# Patient Record
Sex: Female | Born: 1990 | Hispanic: No | Marital: Single
Health system: Northeastern US, Academic
[De-identification: ages and names within clinical notes are randomized; demographics above are authoritative.]

## PROBLEM LIST (undated history)

## (undated) DIAGNOSIS — D649 Anemia, unspecified: Secondary | ICD-10-CM

## (undated) DIAGNOSIS — F909 Attention-deficit hyperactivity disorder, unspecified type: Secondary | ICD-10-CM

## (undated) DIAGNOSIS — R011 Cardiac murmur, unspecified: Secondary | ICD-10-CM

## (undated) DIAGNOSIS — N83209 Unspecified ovarian cyst, unspecified side: Secondary | ICD-10-CM

## (undated) DIAGNOSIS — Z5189 Encounter for other specified aftercare: Secondary | ICD-10-CM

## (undated) DIAGNOSIS — M1990 Unspecified osteoarthritis, unspecified site: Secondary | ICD-10-CM

## (undated) DIAGNOSIS — T7840XA Allergy, unspecified, initial encounter: Secondary | ICD-10-CM

## (undated) DIAGNOSIS — K219 Gastro-esophageal reflux disease without esophagitis: Secondary | ICD-10-CM

## (undated) DIAGNOSIS — E282 Polycystic ovarian syndrome: Secondary | ICD-10-CM

## (undated) HISTORY — DX: Cardiac murmur, unspecified: R01.1

## (undated) HISTORY — DX: Anemia, unspecified: D64.9

## (undated) HISTORY — DX: Gastro-esophageal reflux disease without esophagitis: K21.9

## (undated) HISTORY — DX: Allergy, unspecified, initial encounter: T78.40XA

## (undated) HISTORY — PX: NO PAST SURGERIES: SHX2092

## (undated) HISTORY — DX: Encounter for other specified aftercare: Z51.89

## (undated) HISTORY — DX: Unspecified osteoarthritis, unspecified site: M19.90

---
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
## 2011-07-20 ENCOUNTER — Encounter (HOSPITAL_COMMUNITY): Payer: Self-pay | Admitting: *Deleted

## 2011-07-20 ENCOUNTER — Emergency Department (HOSPITAL_COMMUNITY)
Admission: EM | Admit: 2011-07-20 | Discharge: 2011-07-20 | Disposition: A | Discharge status: Home / Self Care | Payer: Medicaid Other | Attending: Emergency Medicine | Admitting: Emergency Medicine

## 2011-07-20 DIAGNOSIS — R000 Tachycardia, unspecified: Secondary | ICD-10-CM | POA: Insufficient documentation

## 2011-07-20 DIAGNOSIS — J3489 Other specified disorders of nose and nasal sinuses: Secondary | ICD-10-CM | POA: Insufficient documentation

## 2011-07-20 DIAGNOSIS — R059 Cough, unspecified: Secondary | ICD-10-CM | POA: Insufficient documentation

## 2011-07-20 DIAGNOSIS — R509 Fever, unspecified: Secondary | ICD-10-CM | POA: Insufficient documentation

## 2011-07-20 DIAGNOSIS — R51 Headache: Secondary | ICD-10-CM | POA: Insufficient documentation

## 2011-07-20 DIAGNOSIS — IMO0001 Reserved for inherently not codable concepts without codable children: Secondary | ICD-10-CM | POA: Insufficient documentation

## 2011-07-20 DIAGNOSIS — R05 Cough: Secondary | ICD-10-CM | POA: Insufficient documentation

## 2011-07-20 DIAGNOSIS — R6889 Other general symptoms and signs: Secondary | ICD-10-CM | POA: Insufficient documentation

## 2011-07-20 DIAGNOSIS — J029 Acute pharyngitis, unspecified: Secondary | ICD-10-CM | POA: Insufficient documentation

## 2011-07-20 DIAGNOSIS — R599 Enlarged lymph nodes, unspecified: Secondary | ICD-10-CM | POA: Insufficient documentation

## 2011-07-20 MED ORDER — DEXAMETHASONE SODIUM PHOSPHATE 10 MG/ML IJ SOLN
10.0000 mg | Freq: Once | INTRAMUSCULAR | Status: AC
  Administered 2011-07-20: 10 mg via INTRAMUSCULAR
  Filled 2011-07-20: qty 1

## 2011-07-20 NOTE — ED Notes (Signed)
Aching all over her body cold nasal congestion headache and she has a sorethroat for 2 days

## 2011-07-20 NOTE — ED Provider Notes (Signed)
History     CSN: [id number]  Arrival date & time 07/20/11  1949   First MD Initiated Contact with Patient 07/20/11 2125      Chief Complaint  Patient presents with  . Chills    (Consider location/radiation/quality/duration/timing/severity/associated sxs/prior treatment) HPI Comments: Patient here with a several day history of fever, sore throat, cough, runny nose, nasal congestion, and headaches - states fever never over 100 - no difficulty swallowing - reports pain with swallowing and swollen glands.  Patient is a 21 y.o. female presenting with pharyngitis. The history is provided by the patient. No language interpreter was used.  Sore Throat This is a new problem. The current episode started yesterday. The problem occurs constantly. The problem has been gradually worsening. Associated symptoms include congestion, coughing, a fever, headaches, myalgias, a sore throat and swollen glands. Pertinent negatives include no abdominal pain, arthralgias, chest pain, chills, diaphoresis, fatigue, nausea, neck pain, numbness, rash, visual change, vomiting or weakness. The symptoms are aggravated by nothing. She has tried nothing for the symptoms. The treatment provided no relief.  Sore Throat This is a new problem. The current episode started yesterday. The problem occurs constantly. The problem has been gradually worsening. Associated symptoms include headaches. Pertinent negatives include no chest pain and no abdominal pain. The symptoms are aggravated by nothing. She has tried nothing for the symptoms. The treatment provided no relief.    History reviewed. No pertinent past medical history.  History reviewed. No pertinent past surgical history.  History reviewed. No pertinent family history.  History  Substance Use Topics  . Smoking status: Never Smoker   . Smokeless tobacco: Not on file  . Alcohol Use: No    OB History    Grav Para Term Preterm Abortions TAB SAB Ect Mult Living              Review of Systems  Constitutional: Positive for fever. Negative for chills, diaphoresis and fatigue.  HENT: Positive for congestion and sore throat. Negative for neck pain.   Respiratory: Positive for cough.   Cardiovascular: Negative for chest pain.  Gastrointestinal: Negative for nausea, vomiting and abdominal pain.  Musculoskeletal: Positive for myalgias. Negative for arthralgias.  Skin: Negative for rash.  Neurological: Positive for headaches. Negative for weakness and numbness.  All other systems reviewed and are negative.    Allergies  Review of patient's allergies indicates no known allergies.  Home Medications  No current outpatient prescriptions on file.  BP 132/74  Pulse 99  Temp(Src) 99 F (37.2 C) (Oral)  Resp 18  Ht 5' 7.5" (1.715 m)  Wt 123 lb (55.792 kg)  BMI 18.98 kg/m2  SpO2 100%  LMP 07/20/2011  Physical Exam  Nursing note and vitals reviewed. Constitutional: She is oriented to person, place, and time. She appears well-developed and well-nourished. No distress.  HENT:  Head: Normocephalic and atraumatic.  Right Ear: External ear normal.  Left Ear: External ear normal.  Nose: Nose normal.  Mouth/Throat: Posterior oropharyngeal erythema present. No oropharyngeal exudate or tonsillar abscesses.  Eyes: Conjunctivae are normal. Pupils are equal, round, and reactive to light. No scleral icterus.  Neck: Normal range of motion. Neck supple.  Cardiovascular: Regular rhythm and normal heart sounds.  Exam reveals no gallop and no friction rub.   No murmur heard.      tachycardia  Pulmonary/Chest: Effort normal and breath sounds normal. No respiratory distress. She exhibits no tenderness.  Abdominal: Soft. Bowel sounds are normal. She exhibits no distension. There is  no tenderness.  Musculoskeletal: Normal range of motion.  Lymphadenopathy:    She has cervical adenopathy.  Neurological: She is alert and oriented to person, place, and time. No  cranial nerve deficit.  Skin: Skin is warm and dry. No rash noted. No erythema. No pallor.  Psychiatric: She has a normal mood and affect. Her behavior is normal. Judgment and thought content normal.    ED Course  Procedures (including critical care time)   Labs Reviewed  RAPID STREP SCREEN   No results found.   Results for orders placed during the hospital encounter of 07/20/11  RAPID STREP SCREEN      Component Value Range   Streptococcus, Group A Screen (Direct) NEGATIVE  NEGATIVE    No results found. Pharyngitis   MDM  Strep negative - likely viral pharyngitis vs viral URI - given decadron for symptoms - patient will continue OTC medicationsl    Medical screening examination/treatment/procedure(s) were performed by non-physician practitioner and as supervising physician I was immediately available for consultation/collaboration. Osvaldo Human, M.D.     Izola Price [place], [place] 07/20/11 2228  Carleene Cooper III, MD 07/21/11 [phone]

## 2011-07-20 NOTE — ED Notes (Signed)
Discharge instructions reviewed with pt; verbalizes understanding. No questions asked; no further c/o voiced.  Ambulatory to lobby.

## 2011-08-23 ENCOUNTER — Encounter (HOSPITAL_COMMUNITY): Payer: Self-pay | Admitting: Emergency Medicine

## 2011-08-23 ENCOUNTER — Emergency Department (HOSPITAL_COMMUNITY): Payer: Medicaid Other

## 2011-08-23 ENCOUNTER — Emergency Department (HOSPITAL_COMMUNITY)
Admission: EM | Admit: 2011-08-23 | Discharge: 2011-08-23 | Disposition: A | Discharge status: Home / Self Care | Payer: Medicaid Other | Attending: Emergency Medicine | Admitting: Emergency Medicine

## 2011-08-23 DIAGNOSIS — S39012A Strain of muscle, fascia and tendon of lower back, initial encounter: Secondary | ICD-10-CM

## 2011-08-23 DIAGNOSIS — X58XXXA Exposure to other specified factors, initial encounter: Secondary | ICD-10-CM | POA: Insufficient documentation

## 2011-08-23 DIAGNOSIS — M545 Low back pain, unspecified: Secondary | ICD-10-CM | POA: Insufficient documentation

## 2011-08-23 DIAGNOSIS — S335XXA Sprain of ligaments of lumbar spine, initial encounter: Secondary | ICD-10-CM | POA: Insufficient documentation

## 2011-08-23 MED ORDER — IBUPROFEN 800 MG PO TABS: 800.0000 mg | ORAL_TABLET | Freq: Three times a day (TID) | ORAL | Status: AC

## 2011-08-23 NOTE — ED Notes (Signed)
PT c/o lower back pain for approx 1 yr.  St's normally has pain from upper back to lower back. But today its lower back

## 2011-08-23 NOTE — ED Notes (Signed)
Pt with normal, steady gait. Sitting in chair, reading, pt in no distress.

## 2011-08-23 NOTE — ED Provider Notes (Signed)
History     CSN: [id number]  Arrival date & time 08/23/11  1624   First MD Initiated Contact with Patient 08/23/11 1809      Chief Complaint  Patient presents with  . Back Pain    (Consider location/radiation/quality/duration/timing/severity/associated sxs/prior treatment) HPI Comments:  patient presents to the emergency department complains of pain to her low back area she says been going on for about one year intermittently. At times radiates to her upper back but primarily in her lower back. Deniesany. radiation down her legs. Denies any numbness or weakness in her legs. Denies any abdominal pain. Denies any loss of bowel or bladder function. She initially thought it was a mattress in got a new mattress but it did not help her pain. Denies any injuries  Patient is a 21 y.o. female presenting with back pain. The history is provided by the patient.  Back Pain  This is a recurrent problem. Pertinent negatives include no chest pain, no fever, no numbness, no headaches, no abdominal pain and no weakness.    History reviewed. No pertinent past medical history.  History reviewed. No pertinent past surgical history.  No family history on file.  History  Substance Use Topics  . Smoking status: Never Smoker   . Smokeless tobacco: Not on file  . Alcohol Use: No    OB History    Grav Para Term Preterm Abortions TAB SAB Ect Mult Living                  Review of Systems  Constitutional: Negative for fever, chills, diaphoresis and fatigue.  HENT: Negative for congestion, rhinorrhea and sneezing.   Eyes: Negative.   Respiratory: Negative for cough, chest tightness and shortness of breath.   Cardiovascular: Negative for chest pain and leg swelling.  Gastrointestinal: Negative for nausea, vomiting, abdominal pain, diarrhea and blood in stool.  Genitourinary: Negative for frequency, hematuria, flank pain and difficulty urinating.  Musculoskeletal: Positive for back pain. Negative  for arthralgias.  Skin: Negative for rash.  Neurological: Negative for dizziness, speech difficulty, weakness, numbness and headaches.    Allergies  Review of patient's allergies indicates no known allergies.  Home Medications   Current Outpatient Rx  Name Route Sig Dispense Refill  . IBUPROFEN 800 MG PO TABS Oral Take 1 tablet (800 mg total) by mouth 3 (three) times daily. 21 tablet 0    BP 125/66  Pulse 104  Temp(Src) 98.3 F (36.8 C) (Oral)  Resp 18  SpO2 99%  LMP 08/23/2011  Physical Exam  Constitutional: She is oriented to person, place, and time. She appears well-developed and well-nourished.  HENT:  Head: Normocephalic and atraumatic.  Eyes: Pupils are equal, round, and reactive to light.  Neck: Normal range of motion. Neck supple.  Cardiovascular: Normal rate, regular rhythm and normal heart sounds.   Pulmonary/Chest: Effort normal and breath sounds normal. No respiratory distress. She has no wheezes. She has no rales. She exhibits no tenderness.  Abdominal: Soft. Bowel sounds are normal. There is no tenderness. There is no rebound and no guarding.  Musculoskeletal: Normal range of motion. She exhibits no edema.       Mild pain to paraspinal area of lumbar spine, neg SLR bilaterally, patellar reflexes symmetric.    Lymphadenopathy:    She has no cervical adenopathy.  Neurological: She is alert and oriented to person, place, and time. She has normal strength. No sensory deficit.  Skin: Skin is warm and dry. No rash noted.  Psychiatric: She has a normal mood and affect.    ED Course  Procedures (including critical care time)  Labs Reviewed - No data to display Dg Lumbar Spine Complete  08/23/2011  *RADIOLOGY REPORT*  Clinical Data: Chronic low back pain.  No injury  LUMBAR SPINE - COMPLETE 4+ VIEW  Comparison:  None.  Findings:  There is no evidence of lumbar spine fracture. Alignment is normal.  Intervertebral disc spaces are maintained.  IMPRESSION: Negative.   Original Report Authenticated By: Camelia Phenes, M.D.     1. Back strain       MDM  Pain is likely musculoskeletal. There is no evidence of back injury or masses to the lumbar spine. Will give patient exercises to do and refer to ortho for further treatment as needed        Rolan Bucco, MD 08/23/11 1901

## 2011-08-23 NOTE — Discharge Instructions (Signed)
Back Exercises Back exercises help treat and prevent back injuries. The goal of back exercises is to increase the strength of your abdominal and back muscles and the flexibility of your back. These exercises should be started when you no longer have back pain. Back exercises include:  Pelvic Tilt. Lie on your back with your knees bent. Tilt your pelvis until the lower part of your back is against the floor. Hold this position 5 to 10 sec and repeat 5 to 10 times.   Knee to Chest. Pull first 1 knee up against your chest and hold for 20 to 30 seconds, repeat this with the other knee, and then both knees. This may be done with the other leg straight or bent, whichever feels better.   Sit-Ups or Curl-Ups. Bend your knees 90 degrees. Start with tilting your pelvis, and do a partial, slow sit-up, lifting your trunk only 30 to 45 degrees off the floor. Take at least 2 to 3 seconds for each sit-up. Do not do sit-ups with your knees out straight. If partial sit-ups are difficult, simply do the above but with only tightening your abdominal muscles and holding it as directed.   Hip-Lift. Lie on your back with your knees flexed 90 degrees. Push down with your feet and shoulders as you raise your hips a couple inches off the floor; hold for 10 seconds, repeat 5 to 10 times.   Back arches. Lie on your stomach, propping yourself up on bent elbows. Slowly press on your hands, causing an arch in your low back. Repeat 3 to 5 times. Any initial stiffness and discomfort should lessen with repetition over time.   Shoulder-Lifts. Lie face down with arms beside your body. Keep hips and torso pressed to floor as you slowly lift your head and shoulders off the floor.  Do not overdo your exercises, especially in the beginning. Exercises may cause you some mild back discomfort which lasts for a few minutes; however, if the pain is more severe, or lasts for more than 15 minutes, do not continue exercises until you see your  caregiver. Improvement with exercise therapy for back problems is slow.  See your caregivers for assistance with developing a proper back exercise program. Document Released: 07/29/2004 Document Revised: 02/17/2011 Document Reviewed: 06/21/2005 Exit[employer] Patient Information 2012 [employer].Back Pain, Adult Back pain is very common. The pain often gets better over time. The cause of back pain is usually not dangerous. Most people can learn to manage their back pain on their own.  HOME CARE   Stay active. Start with short walks on flat ground if you can. Try to walk farther each day.   Do not sit, drive, or stand in one place for more than 30 minutes. Do not stay in bed.   Do not avoid exercise or work. Activity can help your back heal faster.   Be careful when you bend or lift an object. Bend at your knees, keep the object close to you, and do not twist.   Sleep on a firm mattress. Lie on your side, and bend your knees. If you lie on your back, put a pillow under your knees.   Only take medicines as told by your doctor.   Put ice on the injured area.   Put ice in a plastic bag.   Place a towel between your skin and the bag.   Leave the ice on for 15 to 20 minutes, 3 to 4 times a day for the first   2 to 3 days. After that, you can switch between ice and heat packs.   Ask your doctor about back exercises or massage.   Avoid feeling anxious or stressed. Find good ways to deal with stress, such as exercise.  GET HELP RIGHT AWAY IF:   Your pain does not go away with rest or medicine.   Your pain does not go away in 1 week.   You have new problems.   You do not feel well.   The pain spreads into your legs.   You cannot control when you poop (bowel movement) or pee (urinate).   Your arms or legs feel weak or lose feeling (numbness).   You feel sick to your stomach (nauseous) or throw up (vomit).   You have belly (abdominal) pain.   You feel like you may pass out (faint).    MAKE SURE YOU:   Understand these instructions.   Will watch your condition.   Will get help right away if you are not doing well or get worse.  Document Released: 12/08/2007 Document Revised: 03/03/2011 Document Reviewed: 11/09/2010 [employer] Patient Information 2012 [employer]. 

## 2012-01-27 ENCOUNTER — Ambulatory Visit: Payer: Medicaid Other | Attending: Physical Medicine and Rehabilitation | Admitting: Physical Therapy

## 2012-01-27 DIAGNOSIS — IMO0001 Reserved for inherently not codable concepts without codable children: Secondary | ICD-10-CM | POA: Insufficient documentation

## 2012-01-27 DIAGNOSIS — M545 Low back pain, unspecified: Secondary | ICD-10-CM | POA: Insufficient documentation

## 2012-01-27 DIAGNOSIS — R293 Abnormal posture: Secondary | ICD-10-CM | POA: Insufficient documentation

## 2012-02-08 ENCOUNTER — Ambulatory Visit: Payer: Medicaid Other | Attending: Physical Medicine and Rehabilitation | Admitting: Rehabilitation

## 2012-02-08 DIAGNOSIS — IMO0001 Reserved for inherently not codable concepts without codable children: Secondary | ICD-10-CM | POA: Insufficient documentation

## 2012-02-08 DIAGNOSIS — M545 Low back pain, unspecified: Secondary | ICD-10-CM | POA: Insufficient documentation

## 2012-02-08 DIAGNOSIS — R293 Abnormal posture: Secondary | ICD-10-CM | POA: Insufficient documentation

## 2012-02-10 ENCOUNTER — Ambulatory Visit: Payer: Medicaid Other | Admitting: Rehabilitation

## 2012-02-15 ENCOUNTER — Ambulatory Visit: Payer: Medicaid Other | Admitting: Rehabilitation

## 2012-02-17 ENCOUNTER — Ambulatory Visit: Payer: Medicaid Other | Admitting: Rehabilitation

## 2012-02-22 ENCOUNTER — Ambulatory Visit: Payer: Medicaid Other | Admitting: Physical Therapy

## 2012-02-24 ENCOUNTER — Encounter: Payer: Medicaid Other | Admitting: Physical Therapy

## 2012-02-29 ENCOUNTER — Ambulatory Visit: Payer: Medicaid Other | Admitting: Physical Therapy

## 2012-02-29 ENCOUNTER — Ambulatory Visit: Payer: Medicaid Other | Admitting: Rehabilitation

## 2012-03-02 ENCOUNTER — Encounter: Payer: Medicaid Other | Admitting: Physical Therapy

## 2012-05-19 ENCOUNTER — Encounter (HOSPITAL_COMMUNITY): Payer: Self-pay | Admitting: Emergency Medicine

## 2012-05-19 ENCOUNTER — Emergency Department (INDEPENDENT_AMBULATORY_CARE_PROVIDER_SITE_OTHER)
Admission: EM | Admit: 2012-05-19 | Discharge: 2012-05-19 | Disposition: A | Discharge status: Home / Self Care | Payer: Medicaid Other | Source: Home / Self Care | Attending: Family Medicine | Admitting: Family Medicine

## 2012-05-19 DIAGNOSIS — J329 Chronic sinusitis, unspecified: Secondary | ICD-10-CM

## 2012-05-19 DIAGNOSIS — J069 Acute upper respiratory infection, unspecified: Secondary | ICD-10-CM

## 2012-05-19 HISTORY — DX: Attention-deficit hyperactivity disorder, unspecified type: F90.9

## 2012-05-19 MED ORDER — INFLUENZA VIRUS VACC SPLIT PF IM SUSP: 0.5000 mL | INTRAMUSCULAR | Status: DC

## 2012-05-19 MED ORDER — FLUTICASONE PROPIONATE 50 MCG/ACT NA SUSP: 2.0000 | Freq: Every day | NASAL | Status: DC

## 2012-05-19 MED ORDER — SALINE NASAL SPRAY 0.65 % NA SOLN: 1.0000 | NASAL | Status: DC | PRN

## 2012-05-19 NOTE — ED Notes (Signed)
Pt c/o an episode of "streaks of blood" when she blew her nose this am.... Has not noticed anymore but she just got over a cold... Sx today include: nasal congestion.... Denies: fevers, vomiting, nauseas, diarrhea, sore throat.... Pt is alert w/no signs of distress

## 2012-05-19 NOTE — ED Provider Notes (Signed)
History     CSN: [id number]  Arrival date & time 05/19/12  0944   First MD Initiated Contact with Patient 05/19/12 1036      Chief Complaint  Patient presents with  . URI    (Consider location/radiation/quality/duration/timing/severity/associated sxs/prior treatment) Patient is a 21 y.o. female presenting with URI. The history is provided by the patient.  URI The primary symptoms include cough. The current episode started more than 1 week ago. This is a new problem. The problem has been gradually improving.  The onset of the illness is associated with exposure to sick contacts and recent travel. Symptoms associated with the illness include sinus pressure and congestion. The illness is not associated with chills, plugged ear sensation, facial pain or rhinorrhea. The following treatments were addressed: Acetaminophen was ineffective. A decongestant was effective. Aspirin was not tried. NSAIDs were not tried.  expresses concern in regards to blowing blood tinged mucus from nose.  Past Medical History  Diagnosis Date  . ADHD (attention deficit hyperactivity disorder)     History reviewed. No pertinent past surgical history.  No family history on file.  History  Substance Use Topics  . Smoking status: Never Smoker   . Smokeless tobacco: Not on file  . Alcohol Use: No    OB History    Grav Para Term Preterm Abortions TAB SAB Ect Mult Living                  Review of Systems  Constitutional: Negative for chills.  HENT: Positive for congestion, sneezing and sinus pressure. Negative for rhinorrhea.   Respiratory: Positive for cough.   All other systems reviewed and are negative.    Allergies  Review of patient's allergies indicates no known allergies.  Home Medications   Current Outpatient Rx  Name  Route  Sig  Dispense  Refill  . AMPHETAMINE-DEXTROAMPHETAMINE 10 MG PO TABS   Oral   Take 10 mg by mouth daily.         . ETONOGESTREL 68 MG [place] IMPL  Subcutaneous   Inject 1 each into the skin once.         Marland Kitchen FLUTICASONE PROPIONATE 50 MCG/ACT NA SUSP   Nasal   Place 2 sprays into the nose daily.   16 g   2   . SALINE NASAL SPRAY 0.65 % NA SOLN   Nasal   Place 1 spray into the nose as needed for congestion.   30 mL   12     BP 126/70  Pulse 86  Temp 98.1 F (36.7 C) (Oral)  Resp 16  SpO2 100%  Physical Exam  Nursing note and vitals reviewed. Constitutional: She is oriented to person, place, and time. Vital signs are normal. She appears well-developed and well-nourished. She is active and cooperative.  HENT:  Head: Normocephalic.  Right Ear: Hearing, tympanic membrane, external ear and ear canal normal.  Left Ear: Hearing, tympanic membrane, external ear and ear canal normal.  Nose: Nose normal. Right sinus exhibits no maxillary sinus tenderness and no frontal sinus tenderness. Left sinus exhibits no maxillary sinus tenderness and no frontal sinus tenderness.  Mouth/Throat: Uvula is midline, oropharynx is clear and moist and mucous membranes are normal.  Eyes: Conjunctivae normal are normal. Pupils are equal, round, and reactive to light. No scleral icterus.  Neck: Trachea normal. Neck supple.  Cardiovascular: Normal rate, regular rhythm, intact distal pulses and normal pulses.   Pulmonary/Chest: Effort normal and breath sounds normal.  Lymphadenopathy:  Head (right side): No submental, no submandibular, no tonsillar, no preauricular, no posterior auricular and no occipital adenopathy present.       Head (left side): No submental, no submandibular, no tonsillar, no preauricular, no posterior auricular and no occipital adenopathy present.  Neurological: She is alert and oriented to person, place, and time. No cranial nerve deficit or sensory deficit.  Skin: Skin is warm and dry.  Psychiatric: She has a normal mood and affect. Her speech is normal and behavior is normal. Judgment and thought content normal. Cognition  and memory are normal.    ED Course  Procedures (including critical care time)  Labs Reviewed - No data to display No results found.   1. Sinusitis   2. URI (upper respiratory infection)       MDM  Increase fluids, nasal saline, flonase.       Johnsie Kindred, NP 05/19/12 1115

## 2012-05-23 NOTE — ED Provider Notes (Signed)
Medical screening examination/treatment/procedure(s) were performed by resident physician or non-physician practitioner and as supervising physician I was immediately available for consultation/collaboration.   Shakai Dolley DOUGLAS MD.    Wreatha Sturgeon D Erasmo Vertz, MD 05/23/12 1055 

## 2012-09-08 ENCOUNTER — Encounter: Payer: Medicaid Other | Admitting: Obstetrics & Gynecology

## 2012-09-28 ENCOUNTER — Ambulatory Visit (INDEPENDENT_AMBULATORY_CARE_PROVIDER_SITE_OTHER): Payer: Medicaid Other | Admitting: Obstetrics & Gynecology

## 2012-09-28 ENCOUNTER — Encounter: Payer: Self-pay | Admitting: Obstetrics & Gynecology

## 2012-09-28 VITALS — BP 110/71 | HR 85 | Temp 98.4°F | Ht 67.0 in | Wt 130.9 lb

## 2012-09-28 DIAGNOSIS — R102 Pelvic and perineal pain: Secondary | ICD-10-CM | POA: Insufficient documentation

## 2012-09-28 DIAGNOSIS — N926 Irregular menstruation, unspecified: Secondary | ICD-10-CM | POA: Insufficient documentation

## 2012-09-28 DIAGNOSIS — N921 Excessive and frequent menstruation with irregular cycle: Secondary | ICD-10-CM | POA: Insufficient documentation

## 2012-09-28 DIAGNOSIS — N949 Unspecified condition associated with female genital organs and menstrual cycle: Secondary | ICD-10-CM

## 2012-09-28 DIAGNOSIS — Z975 Presence of (intrauterine) contraceptive device: Secondary | ICD-10-CM | POA: Insufficient documentation

## 2012-09-28 DIAGNOSIS — O26899 Other specified pregnancy related conditions, unspecified trimester: Secondary | ICD-10-CM | POA: Insufficient documentation

## 2012-09-28 MED ORDER — MEDROXYPROGESTERONE ACETATE 5 MG PO TABS: 5.0000 mg | ORAL_TABLET | Freq: Every day | ORAL | Status: DC

## 2012-09-28 NOTE — Progress Notes (Signed)
Patient ID: Sherri Welch, female   DOB: Feb 19, 1991, 22 y.o.   MRN: [id number]  Chief Complaint  Patient presents with  . Pelvic Pain    has family history of fibroids. Pain is not constant,  comes and goes. Feels the pain when she get cold, also it wakes her up at night randomly    HPI [name] is a 22 y.o. female.  [id number] No LMP recorded. Patient has had an implant. Irregular bleeding since Nexplanon placed last Feb. At [facility]. Bleeding daily since last month. Several years of occasional pelvic pain, cramps, often not related to menses. HPI  Past Medical History  Diagnosis Date  . ADHD (attention deficit hyperactivity disorder)   . Arthritis     knees and back    History reviewed. No pertinent past surgical history.  History reviewed. No pertinent family history.  Social History History  Substance Use Topics  . Smoking status: Never Smoker   . Smokeless tobacco: Not on file  . Alcohol Use: No    No Known Allergies  Current Outpatient Prescriptions  Medication Sig Dispense Refill  . amphetamine-dextroamphetamine (ADDERALL) 10 MG tablet Take 10 mg by mouth daily.      Marland Kitchen etonogestrel (NEXPLANON) 68 MG IMPL implant Inject 1 each into the skin once.      . fluticasone (FLONASE) 50 MCG/ACT nasal spray Place 2 sprays into the nose daily.  16 g  2  . medroxyPROGESTERone (PROVERA) 5 MG tablet Take 1 tablet (5 mg total) by mouth daily.  30 tablet  2  . sodium chloride (OCEAN NASAL SPRAY) 0.65 % nasal spray Place 1 spray into the nose as needed for congestion.  30 mL  12   No current facility-administered medications for this visit.    Review of Systems Review of Systems  Constitutional: Negative for fever and activity change.  Gastrointestinal: Negative for vomiting and abdominal distention.  Genitourinary: Positive for vaginal bleeding and pelvic pain. Negative for dysuria, vaginal discharge and dyspareunia.    Blood pressure 110/71, pulse 85, temperature 98.4 F  (36.9 C), temperature source Oral, height 5\' 7"  (1.702 m), weight 130 lb 14.4 oz (59.376 kg).  Physical Exam Physical Exam  Constitutional: She is oriented to person, place, and time. She appears well-developed. No distress.  Pulmonary/Chest: Effort normal. No respiratory distress.  Abdominal: Soft. She exhibits no mass. There is no tenderness.  Genitourinary: Vagina normal and uterus normal. No vaginal discharge found.  Slight blood,not tender, no mass  Neurological: She is alert and oriented to person, place, and time.  Psychiatric: She has a normal mood and affect. Her behavior is normal.    Data Reviewed Patient Active Problem List  Diagnosis  . Irregular uterine bleeding  . Pelvic pain in pregnancy  . Breakthrough bleeding on Nexplanon     Assessment     Patient Active Problem List  Diagnosis  . Irregular uterine bleeding  . Pelvic pain in pregnancy  . Breakthrough bleeding on Nexplanon      Plan    Provera 5 mg daily, RTC 6 weeks NSAID prn pain        ARNOLD,JAMES 09/28/2012, 4:58 PM

## 2012-09-28 NOTE — Patient Instructions (Signed)
Dysfunctional Uterine Bleeding  Normally, menstrual periods begin between ages 11 to 17 in young women. A normal menstrual cycle/period may begin every 23 days up to 35 days and lasts from 1 to 7 days. Around 12 to 14 days before your menstrual period starts, ovulation (ovary produces an egg) occurs. When counting the time between menstrual periods, count from the first day of bleeding of the previous period to the first day of bleeding of the next period.  Dysfunctional (abnormal) uterine bleeding is bleeding that is different from a normal menstrual period. Your periods may come earlier or later than usual. They may be lighter, have blood clots or be heavier. You may have bleeding between periods, or you may skip one period or more. You may have bleeding after sexual intercourse, bleeding after menopause, or no menstrual period.  CAUSES   · Pregnancy (normal, miscarriage, tubal).  · IUDs (intrauterine device, birth control).  · Birth control pills.  · Hormone treatment.  · Menopause.  · Infection of the cervix.  · Blood clotting problems.  · Infection of the inside lining of the uterus.  · Endometriosis, inside lining of the uterus growing in the pelvis and other female organs.  · Adhesions (scar tissue) inside the uterus.  · Obesity or severe weight loss.  · Uterine polyps inside the uterus.  · Cancer of the vagina, cervix, or uterus.  · Ovarian cysts or polycystic ovary syndrome.  · Medical problems (diabetes, thyroid disease).  · Uterine fibroids (noncancerous tumor).  · Problems with your female hormones.  · Endometrial hyperplasia, very thick lining and enlarged cells inside of the uterus.  · Medicines that interfere with ovulation.  · Radiation to the pelvis or abdomen.  · Chemotherapy.  DIAGNOSIS   · Your doctor will discuss the history of your menstrual periods, medicines you are taking, changes in your weight, stress in your life, and any medical problems you may have.  · Your doctor will do a physical  and pelvic examination.  · Your doctor may want to perform certain tests to make a diagnosis, such as:  · Pap test.  · Blood tests.  · Cultures for infection.  · CT scan.  · Ultrasound.  · Hysteroscopy.  · Laparoscopy.  · MRI.  · Hysterosalpingography.  · D and C.  · Endometrial biopsy.  TREATMENT   Treatment will depend on the cause of the dysfunctional uterine bleeding (DUB). Treatment may include:  · Observing your menstrual periods for a couple of months.  · Prescribing medicines for medical problems, including:  · Antibiotics.  · Hormones.  · Birth control pills.  · Removing an IUD (intrauterine device, birth control).  · Surgery:  · D and C (scrape and remove tissue from inside the uterus).  · Laparoscopy (examine inside the abdomen with a lighted tube).  · Uterine ablation (destroy lining of the uterus with electrical current, laser, heat, or freezing).  · Hysteroscopy (examine cervix and uterus with a lighted tube).  · Hysterectomy (remove the uterus).  HOME CARE INSTRUCTIONS   · If medicines were prescribed, take exactly as directed. Do not change or switch medicines without consulting your caregiver.  · Long term heavy bleeding may result in iron deficiency. Your caregiver may have prescribed iron pills. They help replace the iron that your body lost from heavy bleeding. Take exactly as directed.  · Do not take aspirin or medicines that contain aspirin one week before or during your menstrual period. Aspirin may make   the bleeding worse.  · If you need to change your sanitary pad or tampon more than once every 2 hours, stay in bed with your feet elevated and a cold pack on your lower abdomen. Rest as much as possible, until the bleeding stops or slows down.  · Eat well-balanced meals. Eat foods high in iron. Examples are:  · Leafy green vegetables.  · Whole-grain breads and cereals.  · Eggs.  · Meat.  · Liver.  · Do not try to lose weight until the abnormal bleeding has stopped and your blood iron level is  back to normal. Do not lift more than ten pounds or do strenuous activities when you are bleeding.  · For a couple of months, make note on your calendar, marking the start and ending of your period, and the type of bleeding (light, medium, heavy, spotting, clots or missed periods). This is for your caregiver to better evaluate your problem.  SEEK MEDICAL CARE IF:   · You develop nausea (feeling sick to your stomach) and vomiting, dizziness, or diarrhea while you are taking your medicine.  · You are getting lightheaded or weak.  · You have any problems that may be related to the medicine you are taking.  · You develop pain with your DUB.  · You want to remove your IUD.  · You want to stop or change your birth control pills or hormones.  · You have any type of abnormal bleeding mentioned above.  · You are over 16 years old and have not had a menstrual period yet.  · You are 22 years old and you are still having menstrual periods.  · You have any of the symptoms mentioned above.  · You develop a rash.  SEEK IMMEDIATE MEDICAL CARE IF:   · An oral temperature above 102° F (38.9° C) develops.  · You develop chills.  · You are changing your sanitary pad or tampon more than once an hour.  · You develop abdominal pain.  · You pass out or faint.  Document Released: 06/18/2000 Document Revised: 09/13/2011 Document Reviewed: 05/20/2009  ExitCare® Patient Information ©2013 [employer].

## 2012-10-13 ENCOUNTER — Emergency Department (INDEPENDENT_AMBULATORY_CARE_PROVIDER_SITE_OTHER): Payer: Medicaid Other

## 2012-10-13 ENCOUNTER — Emergency Department (INDEPENDENT_AMBULATORY_CARE_PROVIDER_SITE_OTHER)
Admission: EM | Admit: 2012-10-13 | Discharge: 2012-10-13 | Disposition: A | Discharge status: Home / Self Care | Payer: Medicaid Other | Source: Home / Self Care

## 2012-10-13 ENCOUNTER — Encounter (HOSPITAL_COMMUNITY): Payer: Self-pay | Admitting: Emergency Medicine

## 2012-10-13 DIAGNOSIS — S93609A Unspecified sprain of unspecified foot, initial encounter: Secondary | ICD-10-CM

## 2012-10-13 DIAGNOSIS — S93601A Unspecified sprain of right foot, initial encounter: Secondary | ICD-10-CM

## 2012-10-13 NOTE — ED Notes (Signed)
3  Inch  Ace  Applied  To  r  Ankle

## 2012-10-13 NOTE — ED Notes (Signed)
Pt c/o right foot inj Reports tripping on the stairs; regained balance, did not hit floor but did twist ankles Right foot bothering her; pain increases w/activity Taking tyle 500mg   Sx include: pain and swelling  She is alert and oriented w/no signs of acute distress.

## 2012-10-13 NOTE — ED Provider Notes (Signed)
Medical screening examination/treatment/procedure(s) were performed by resident physician or non-physician practitioner and as supervising physician I was immediately available for consultation/collaboration.   KINDL,JAMES DOUGLAS MD.   James D Kindl, MD 10/13/12 1957 

## 2012-10-13 NOTE — ED Provider Notes (Signed)
History     CSN: [id number]  Arrival date & time 10/13/12  1242   First MD Initiated Contact with Patient 10/13/12 1420      Chief Complaint  Patient presents with  . Foot Injury    (Consider location/radiation/quality/duration/timing/severity/associated sxs/prior treatment) HPI Comments: 22 year old female running down the steps tripped and landed hard on her feet. She states that she twisted her right foot. There is pain with persistent weightbearing and ambulation. Mild soreness in the ankle. Denies other injuries.   Past Medical History  Diagnosis Date  . ADHD (attention deficit hyperactivity disorder)   . Arthritis     knees and back    History reviewed. No pertinent past surgical history.  No family history on file.  History  Substance Use Topics  . Smoking status: Never Smoker   . Smokeless tobacco: Not on file  . Alcohol Use: No    OB History   Grav Para Term Preterm Abortions TAB SAB Ect Mult Living   0 0 0 0 0 0 0 0 0 0       Review of Systems  Constitutional: Negative for fever, chills and activity change.  HENT: Negative.   Respiratory: Negative.   Musculoskeletal:       As per HPI  Skin: Negative for color change, pallor and rash.  Neurological: Negative.     Allergies  Review of patient's allergies indicates no known allergies.  Home Medications   Current Outpatient Rx  Name  Route  Sig  Dispense  Refill  . amphetamine-dextroamphetamine (ADDERALL) 10 MG tablet   Oral   Take 10 mg by mouth daily.         Marland Kitchen etonogestrel (NEXPLANON) 68 MG IMPL implant   Subcutaneous   Inject 1 each into the skin once.         . fluticasone (FLONASE) 50 MCG/ACT nasal spray   Nasal   Place 2 sprays into the nose daily.   16 g   2   . medroxyPROGESTERone (PROVERA) 5 MG tablet   Oral   Take 1 tablet (5 mg total) by mouth daily.   30 tablet   2   . sodium chloride (OCEAN NASAL SPRAY) 0.65 % nasal spray   Nasal   Place 1 spray into the nose as  needed for congestion.   30 mL   12     BP 113/74  Pulse 79  Temp(Src) 98.7 F (37.1 C) (Oral)  Physical Exam  Constitutional: She is oriented to person, place, and time. She appears well-developed and well-nourished. No distress.  HENT:  Head: Normocephalic and atraumatic.  Eyes: EOM are normal.  Neck: Normal range of motion. Neck supple.  Musculoskeletal:  Right foot exam reveals normal architecture. Minimal swelling over the proximal foot over the third metatarsal. This is the area of point tenderness. No tenderness of the other metatarsals, toes or ankle. No other bony tenderness. Ankle with full range of motion. Distal neurovascular and motor sensory is intact  Neurological: She is alert and oriented to person, place, and time. No cranial nerve deficit.  Skin: Skin is warm and dry.  Psychiatric: She has a normal mood and affect.    ED Course  Procedures (including critical care time)  Labs Reviewed - No data to display Dg Foot Complete Right  10/13/2012  *RADIOLOGY REPORT*  Clinical Data: Fall, third metatarsal injury.  RIGHT FOOT COMPLETE - 3+ VIEW  Comparison: None.  Findings: Three views of the right foot demonstrate no  acute fracture or malalignment.  Bony mineralization is within normal limits.  No focal soft tissue swelling.  Incidental note is made of a small os peroneus.  IMPRESSION: Negative radiographs of the right foot.   Original Report Authenticated By: Malachy Moan, M.D.      1. Right foot sprain, initial encounter       MDM  Apply to the hand wrapped to the foot for stabilization. Limit the amount of weight and stress placed on the foot for the next 4-5 days. Apply ice as directed and keep elevated. Return instructions for additional care for spreading of the foot. Followup with her primary care doctor as needed. For any worsening new symptoms or problems may return        Hayden Rasmussen, NP 10/13/12 1510

## 2013-03-07 ENCOUNTER — Encounter: Payer: Self-pay | Admitting: *Deleted

## 2013-03-26 ENCOUNTER — Ambulatory Visit (INDEPENDENT_AMBULATORY_CARE_PROVIDER_SITE_OTHER)
Admission: RE | Admit: 2013-03-26 | Discharge: 2013-03-26 | Disposition: A | Discharge status: Home / Self Care | Payer: Medicaid Other | Source: Ambulatory Visit | Attending: Internal Medicine | Admitting: Internal Medicine

## 2013-03-26 ENCOUNTER — Other Ambulatory Visit (INDEPENDENT_AMBULATORY_CARE_PROVIDER_SITE_OTHER): Payer: Medicaid Other

## 2013-03-26 ENCOUNTER — Ambulatory Visit: Payer: Medicaid Other | Admitting: Cardiovascular Disease

## 2013-03-26 ENCOUNTER — Ambulatory Visit (INDEPENDENT_AMBULATORY_CARE_PROVIDER_SITE_OTHER): Payer: Medicaid Other | Admitting: Internal Medicine

## 2013-03-26 ENCOUNTER — Encounter: Payer: Self-pay | Admitting: Internal Medicine

## 2013-03-26 VITALS — BP 108/64 | HR 85 | Temp 98.2°F | Ht 67.0 in | Wt 134.6 lb

## 2013-03-26 DIAGNOSIS — R0609 Other forms of dyspnea: Secondary | ICD-10-CM

## 2013-03-26 DIAGNOSIS — R0600 Dyspnea, unspecified: Secondary | ICD-10-CM

## 2013-03-26 LAB — CBC WITH DIFFERENTIAL/PLATELET
Basophils Absolute: 0 10*3/uL (ref 0.0–0.1)
Eosinophils Absolute: 0.1 10*3/uL (ref 0.0–0.7)
Lymphocytes Relative: 33 % (ref 12.0–46.0)
MCHC: 33.6 g/dL (ref 30.0–36.0)
Monocytes Relative: 7.9 % (ref 3.0–12.0)
Neutro Abs: 3 10*3/uL (ref 1.4–7.7)
Neutrophils Relative %: 57.6 % (ref 43.0–77.0)
Platelets: 240 10*3/uL (ref 150.0–400.0)
RDW: 14.2 % (ref 11.5–14.6)

## 2013-03-26 LAB — BASIC METABOLIC PANEL
BUN: 8 mg/dL (ref 6–23)
Calcium: 9.2 mg/dL (ref 8.4–10.5)
Creatinine, Ser: 0.5 mg/dL (ref 0.4–1.2)
GFR: 183.26 mL/min (ref >60.00)
Glucose, Bld: 86 mg/dL (ref 70–99)

## 2013-03-26 LAB — TSH: TSH: 2 u[IU]/mL (ref 0.35–5.50)

## 2013-03-26 LAB — BRAIN NATRIURETIC PEPTIDE: Pro B Natriuretic peptide (BNP): 23 pg/mL (ref 0.0–100.0)

## 2013-03-26 NOTE — Progress Notes (Signed)
Quick Note:  Spoke with pt and notified of results per Dr. Wert. Pt verbalized understanding and denied any questions.  ______ 

## 2013-03-26 NOTE — Patient Instructions (Addendum)
Try prilosec 20mg   Take 30-60 min before first meal of the day and Pepcid 20 mg one bedtime until  You have your CPST in 2 weeks (no sooner and ok to cancel if symptoms resolve on treatment for excess stomach acid)  Before we shedule your cpst, you'll need to complete the lab and xrays today and I'll need to review them first.  GERD (REFLUX)  is an extremely common cause of respiratory symptoms (like yours!), many times with no significant heartburn at all.    It can be treated with medication, but also with lifestyle changes including avoidance of late meals, excessive alcohol, smoking cessation, and avoid fatty foods, chocolate, peppermint, colas, red wine, and acidic juices such as orange juice.  NO MINT OR MENTHOL PRODUCTS SO NO COUGH DROPS  USE SUGARLESS CANDY INSTEAD (jolley ranchers or Stover's)  NO OIL BASED VITAMINS - use powdered substitutes.

## 2013-03-26 NOTE — Progress Notes (Addendum)
  Subjective:    Patient ID: Sherri Welch, female    DOB: 05/10/1991  MRN: [id number]  HPI  80 yobf never smoker always healthy and did track in [place] [employer] and the mile and did fine then and in 2013 but then new onset doe referred 03/26/2013 to pulmonary clinic by Hinton Lovely   03/26/2013 1st [facility] office visit/ Ronesha Heenan on BCPs cc new onset DOE in Ct July 2014 worse talking or blowing [place] horn, "passed out" once standing up and recovered w/in a min still standing.  Already eval by cards for assoc chest tightness, neg stress test  No obvious day to day or daytime variabilty or assoc chronic cough  , subjective wheeze overt sinus or hb symptoms. No unusual exp hx or h/o childhood pna/ asthma or knowledge of premature birth.  Sleeping ok without nocturnal  or early am exacerbation  of respiratory  c/o's or need for noct saba. Also denies any obvious fluctuation of symptoms with weather or environmental changes or other aggravating or alleviating factors except as outlined above   Current Medications, Allergies, Complete Past Medical History, Past Surgical History, Family History, and Social History were reviewed in [employer] record.     Chief Complaint  Patient presents with  . Pulmonary Consult    Referred per Hinton Lovely, NP. Pt states that she has noticed DOE for the past year, but is has been esp worse for the past 3 months. She states that she gets out of breath is she is walking and talking at the same time. 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      Review of Systems  Constitutional: Negative for fever, chills and unexpected weight change.  HENT: Negative for ear pain, nosebleeds, congestion, sore throat, rhinorrhea, sneezing, trouble swallowing, dental problem, voice change, postnasal drip and sinus pressure.   Eyes: Negative for visual disturbance.  Respiratory: Positive  for shortness of breath. Negative for cough and choking.   Cardiovascular: Positive for chest pain. Negative for leg swelling.  Gastrointestinal: Negative for vomiting, abdominal pain and diarrhea.  Genitourinary: Negative for difficulty urinating.  Musculoskeletal: Negative for arthralgias.  Skin: Negative for rash.  Neurological: Negative for tremors, syncope and headaches.  Hematological: Does not bruise/bleed easily.       Objective:   Physical Exam  Wt Readings from Last 3 Encounters:  03/26/13 134 lb 9.6 oz (61.054 kg)  09/28/12 130 lb 14.4 oz (59.376 kg)  07/20/11 123 lb (55.792 kg) (42%*, Z = -0.21)   * Growth percentiles are based on CDC 2-20 Years data.      HEENT: nl dentition, turbinates, and orophanx. Nl external ear canals without cough reflex   NECK :  without JVD/Nodes/TM/ nl carotid upstrokes bilaterally   LUNGS: no acc muscle use, clear to A and P bilaterally without cough on insp or exp maneuvers   CV:  RRR  no s3 or murmur or increase in P2, no edema   ABD:  soft and nontender with nl excursion in the supine position. No bruits or organomegaly, bowel sounds nl  MS:  warm without deformities, calf tenderness, cyanosis or clubbing  SKIN: warm and dry without lesions    NEURO:  alert, approp, no deficits    CXR  03/26/2013 :  No active cardiopulmonary disease.        Assessment & Plan:

## 2013-03-26 NOTE — Assessment & Plan Note (Signed)
Symptoms are markedly disproportionate to objective findings and not clear this is a lung problem but pt does appear to have difficult airway management issues. DDX of  difficult airways managment all start with A and  include Adherence, Ace Inhibitors, Acid Reflux, Active Sinus Disease, Alpha 1 Antitripsin deficiency, Anxiety masquerading as Airways dz,  ABPA,  allergy(esp in young), Aspiration (esp in elderly), Adverse effects of DPI,  Active smokers, plus two Bs  = Bronchiectasis and Beta blocker use..and one C= CHF  ? Acid (or non-acid) GERD > always difficult to exclude as up to 75% of pts in some series report no assoc GI/ Heartburn symptoms> rec max (24h)  acid suppression and diet restrictions/ reviewed and instructions given in writting   ? Anxiety > dx of exclusion  The drop is sats and use of bcp's bothersome for Active PE, not one of the usual A's but a concern nonetheless > CTa ordered > if neg then cpst with spirometry before and after needed

## 2013-03-28 ENCOUNTER — Telehealth: Payer: Self-pay | Admitting: Internal Medicine

## 2013-03-28 ENCOUNTER — Ambulatory Visit (INDEPENDENT_AMBULATORY_CARE_PROVIDER_SITE_OTHER)
Admission: RE | Admit: 2013-03-28 | Discharge: 2013-03-28 | Disposition: A | Discharge status: Home / Self Care | Payer: Medicaid Other | Source: Ambulatory Visit | Attending: Internal Medicine | Admitting: Internal Medicine

## 2013-03-28 ENCOUNTER — Encounter: Payer: Self-pay | Admitting: Internal Medicine

## 2013-03-28 DIAGNOSIS — R0609 Other forms of dyspnea: Secondary | ICD-10-CM

## 2013-03-28 DIAGNOSIS — R0600 Dyspnea, unspecified: Secondary | ICD-10-CM

## 2013-03-28 MED ORDER — IOHEXOL 350 MG/ML SOLN
80.0000 mL | Freq: Once | INTRAVENOUS | Status: AC | PRN
  Administered 2013-03-28: 80 mL via INTRAVENOUS

## 2013-03-28 NOTE — Telephone Encounter (Signed)
MW not in office this afternoon Pt aware will receive call back tomorrow Dr Sherene Sires please advise, thank you.

## 2013-03-29 ENCOUNTER — Other Ambulatory Visit: Payer: Self-pay | Admitting: Internal Medicine

## 2013-03-29 NOTE — Progress Notes (Signed)
Quick Note:  Spoke with pt and notified of results per Dr. Wert. Pt verbalized understanding and denied any questions.  ______ 

## 2013-03-29 NOTE — Telephone Encounter (Signed)
Pt already made aware of results.  Notes Recorded by Christen Butter, CMA on 03/29/2013 at 11:46 AM Spoke with pt and notified of results per Dr. Sherene Sires. Pt verbalized understanding and denied any questions.  Notes Recorded by Nyoka Cowden, MD on 03/28/2013 at 5:32 PM Call patient : Set up cpst with spirometry before and after

## 2013-04-11 ENCOUNTER — Ambulatory Visit (HOSPITAL_COMMUNITY): Payer: Medicaid Other | Attending: Internal Medicine

## 2013-04-11 DIAGNOSIS — R0609 Other forms of dyspnea: Secondary | ICD-10-CM

## 2013-04-11 DIAGNOSIS — R0989 Other specified symptoms and signs involving the circulatory and respiratory systems: Secondary | ICD-10-CM | POA: Insufficient documentation

## 2013-04-11 MED ORDER — DIPHENHYDRAMINE HCL 25 MG PO CAPS
50.0000 mg | ORAL_CAPSULE | Freq: Once | ORAL | Status: AC
  Administered 2013-03-28: 50 mg via ORAL

## 2013-04-14 ENCOUNTER — Encounter: Payer: Self-pay | Admitting: Internal Medicine

## 2013-04-14 DIAGNOSIS — R0602 Shortness of breath: Secondary | ICD-10-CM

## 2013-08-10 ENCOUNTER — Encounter: Payer: Self-pay | Admitting: Internal Medicine

## 2013-08-10 ENCOUNTER — Ambulatory Visit (INDEPENDENT_AMBULATORY_CARE_PROVIDER_SITE_OTHER): Payer: Medicaid Other | Admitting: Internal Medicine

## 2013-08-10 VITALS — BP 104/58 | HR 108 | Temp 97.9°F | Resp 12 | Ht 67.0 in | Wt 135.0 lb

## 2013-08-10 DIAGNOSIS — N926 Irregular menstruation, unspecified: Secondary | ICD-10-CM

## 2013-08-10 NOTE — Patient Instructions (Signed)
Please return in a year. If you can , please be off Nexplanon for 2 months before next appt so we can check labs. Please work on including fruit and veggies in your diet and limit carbs and fats.  Also, increase exercise: at least 30 min 5x a week.   Polycystic Ovarian Syndrome Polycystic ovarian syndrome (PCOS) is a common hormonal disorder among women of reproductive age. Most women with PCOS grow many small cysts on their ovaries. PCOS can cause problems with your periods and make it difficult to get pregnant. It can also cause an increased risk of miscarriage with pregnancy. If left untreated, PCOS can lead to serious health problems, such as diabetes and heart disease. CAUSES The cause of PCOS is not fully understood, but genetics may be a factor. SIGNS AND SYMPTOMS   Infrequent or no menstrual periods.   Inability to get pregnant (infertility) because of not ovulating.   Increased growth of hair on the face, chest, stomach, back, thumbs, thighs, or toes.   Acne, oily skin, or dandruff.   Pelvic pain.   Weight gain or obesity, usually carrying extra weight around the waist.   Type 2 diabetes.   High cholesterol.   High blood pressure.   Female-pattern baldness or thinning hair.   Patches of thickened and dark brown or black skin on the neck, arms, breasts, or thighs.   Tiny excess flaps of skin (skin tags) in the armpits or neck area.   Excessive snoring and having breathing stop at times while asleep (sleep apnea).   Deepening of the voice.   Gestational diabetes when pregnant.  DIAGNOSIS  There is no single test to diagnose PCOS.   Your health care provider will:   Take a medical history.   Perform a pelvic exam.   Have ultrasonography done.   Check your female and female hormone levels.   Measure glucose or sugar levels in the blood.   Do other blood tests.   If you are producing too many female hormones, your health care provider  will make sure it is from PCOS. At the physical exam, your health care provider will want to evaluate the areas of increased hair growth. Try to allow natural hair growth for a few days before the visit.   During a pelvic exam, the ovaries may be enlarged or swollen because of the increased number of small cysts. This can be seen more easily by using vaginal ultrasonography or screening to examine the ovaries and lining of the uterus (endometrium) for cysts. The uterine lining may become thicker if you have not been having a regular period.  TREATMENT  Because there is no cure for PCOS, it needs to be managed to prevent problems. Treatments are based on your symptoms. Treatment is also based on whether you want to have a baby or whether you need contraception.  Treatment may include:   Progesterone hormone to start a menstrual period.   Birth control pills to make you have regular menstrual periods.   Medicines to make you ovulate, if you want to get pregnant.   Medicines to control your insulin.   Medicine to control your blood pressure.   Medicine and diet to control your high cholesterol and triglycerides in your blood.  Medicine to reduce excessive hair growth.  Surgery, making small holes in the ovary, to decrease the amount of female hormone production. This is done through a long, lighted tube (laparoscope) placed into the pelvis through a tiny incision in  the lower abdomen.  HOME CARE INSTRUCTIONS  Only take over-the-counter or prescription medicine as directed by your health care provider.  Pay attention to the foods you eat and your activity levels. This can help reduce the effects of PCOS.  Keep your weight under control.  Eat foods that are low in carbohydrate and high in fiber.  Exercise regularly. SEEK MEDICAL CARE IF:  Your symptoms do not get better with medicine.  You have new symptoms. Document Released: 10/15/2004 Document Revised: 04/11/2013 Document  Reviewed: 12/07/2012 [facility] CenterExit[facility] Patient Information 2014 [place]ExitCare, [place].

## 2013-08-10 NOTE — Progress Notes (Signed)
Patient ID: Sherri Welch, female   DOB: 04/28/1991, 23 y.o.   MRN: [id number]  HPI: Sherri ChimeraMauricia Nyland is a 23 y.o. female, referred by Dr Elsie LincolnKelly Leggett, for evaluation for PCOS.  Fertility/Menstrual cycles: - menarche at [age] y/o - irregular menses from menarche - either longer cycles or skipping months, heavy bleeding, blood clots; severe dysmenorrhea - and also a different type of pain in LLQ once a mo or every other mo - she has been on DepoProvera (since she had FH of "blood clots") - no h/o ovarian cysts but no U/S checked  - children: 0 - miscarriages: could have had 1 (unsure), at 23 y/o - contraception: Nexplanon 08/27/2011 >> cycles unpredictable and heavy >>  Needs to change it in 08/2014  Weight gain: - not significant - no steroid use - no weight loss meds - Exercise: no  Acne: - some  Hirsutism: - upper chin - light  - some hair on lower back and abdomen  Treatments tried: - did not try Metformin - did not try Spironolactone - did not try Vaniqua - on Nexplanon  Other meds: - Adderall >> not taking   Other medical pbs: - ADHD  Reviewed records from Dr. Penne LashLeggett from 07/09/2013:  Total testosterone 71 (10-70)  Insulin fasting 88 (3-28  17 hydroxyprogesterone 32 (less than 185)  DHEA-sulfate 173 (35-430)  Prolactin 6.7 (2.8-29.2)  OGTT: Fasting blood glu 85, 1 hour 94 03/20/2012:  Lipids: 138/35/58/73  FH of DM in GM and aunts. Aunts with overweight.  ROS: Constitutional: no weight gain, no fatigue, no subjective hyperthermia/hypothermia Eyes: no blurry vision, no xerophthalmia ENT: no sore throat, no nodules palpated in throat, no dysphagia/odynophagia, no hoarseness Cardiovascular: no CP/SOB/palpitations/leg swelling Respiratory: no cough/SOB Gastrointestinal: no N/V/D/C Musculoskeletal: no muscle/joint aches Skin: no acne, minimum hair on upper lip Neurological: no tremors/numbness/tingling/dizziness Psychiatric: no  depression/anxiety  Past Medical History  Diagnosis Date  . ADHD (attention deficit hyperactivity disorder)   . Arthritis     knees and back  . Heart murmur    History reviewed. No pertinent past surgical history. History   Social History  . Marital Status: Single    Spouse Name: N/A    Number of Children: 0   Occupational History  . Student    Social History Main Topics  . Smoking status: Never Smoker   . Smokeless tobacco: Never Used  . Alcohol Use: No  . Drug Use: No   Current Outpatient Prescriptions on File Prior to Visit  Medication Sig Dispense Refill  . etonogestrel (NEXPLANON) 68 MG IMPL implant Inject 1 each into the skin once.       No current facility-administered medications on file prior to visit.   Allergies  Allergen Reactions  . Iodinated Diagnostic Agents Itching    Itching and hives.    Family History  Problem Relation Age of Onset  . Allergies Brother   . Asthma Mother   . Asthma Brother   . Heart disease Maternal Grandmother    PE: BP 104/58  Pulse 108  Temp(Src) 97.9 F (36.6 C) (Oral)  Resp 12  Ht 5\' 7"  (1.702 m)  Wt 135 lb (61.236 kg)  BMI 21.14 kg/m2  SpO2 98% Wt Readings from Last 3 Encounters:  08/10/13 135 lb (61.236 kg)  03/26/13 134 lb 9.6 oz (61.054 kg)  09/28/12 130 lb 14.4 oz (59.376 kg)   Constitutional: overweight, in NAD, no full supraclavicular fat pads Eyes: PERRLA, EOMI, no exophthalmos ENT: moist mucous membranes, no  thyromegaly, no cervical lymphadenopathy Cardiovascular: RRR, No MRG Respiratory: CTA B Gastrointestinal: abdomen soft, NT, ND, BS+ Musculoskeletal: no deformities, strength intact in all 4 Skin: moist, warm; no acne on face, no dark terminal hair on chin, no vellum on sideburns, no skin tags, no purple, wide, stretch marks Neurological: no tremor with outstretched hands, DTR normal in all 4  ASSESSMENT: 1. ?PCOS  PLAN: 1.  I had a long discussion with the patient about the fact that the  PCOS is a misnomer, a patient does not necessarily have to have polycystic ovaries to be diagnosed with the disorder. This is of sum of several conditions, including:  weight gain (no)  insulin resistance (and therefore a higher risk of developing diabetes later in life) >> she has a high insulin level per records from PCP  Acne (mild)  Hirsutism (no)  irregular menstrual cycles (yes)  decreased fertility. - We also discussed about the fact that the treatment is usually targeted to addressing the problem that concerns the patient the most: acne/hirsutism, weight gain, or fertility, but there is no single treatment for PCOS.  - The first-line therapy are oral contraceptives. If she is concerned with her weight, we can use metformin; if she is concerned about acne/hirsutism, we can add spironolactone; and if she is concerned about fertility, I could refer her to reproductive endocrinology. - I believe that she might have mild PCOS, but not significant clinical manifestation of the condition. She had a mildly elevated total testosterone, but no free testosterone available. Since she is on Nexplanon, we cannot check LH, FSH, estrogen. I do not feel we benefit from taking the testosterone at this point.  - We discussed about coming back in a year, ideally about 2 months after taken out the Nexplanon, so we can check the above. She agrees with this. - We discussed about what insulin resistance means, possible consequences, and ways to improve her insulin sensitivity. I suggested to improve her diet and start exercising. We will add a hemoglobin A1c at next visit.  - Return in about 1 year (around 08/10/2014).

## 2013-08-12 ENCOUNTER — Emergency Department (HOSPITAL_COMMUNITY): Payer: Medicaid Other

## 2013-08-12 ENCOUNTER — Encounter (HOSPITAL_COMMUNITY): Payer: Self-pay | Admitting: Emergency Medicine

## 2013-08-12 ENCOUNTER — Emergency Department (HOSPITAL_COMMUNITY)
Admission: EM | Admit: 2013-08-12 | Discharge: 2013-08-12 | Disposition: A | Discharge status: Home / Self Care | Payer: Medicaid Other | Attending: Emergency Medicine | Admitting: Emergency Medicine

## 2013-08-12 DIAGNOSIS — S91309A Unspecified open wound, unspecified foot, initial encounter: Secondary | ICD-10-CM | POA: Insufficient documentation

## 2013-08-12 DIAGNOSIS — Y9389 Activity, other specified: Secondary | ICD-10-CM | POA: Insufficient documentation

## 2013-08-12 DIAGNOSIS — Z79899 Other long term (current) drug therapy: Secondary | ICD-10-CM | POA: Insufficient documentation

## 2013-08-12 DIAGNOSIS — Z8739 Personal history of other diseases of the musculoskeletal system and connective tissue: Secondary | ICD-10-CM | POA: Insufficient documentation

## 2013-08-12 DIAGNOSIS — T148XXA Other injury of unspecified body region, initial encounter: Secondary | ICD-10-CM

## 2013-08-12 DIAGNOSIS — R011 Cardiac murmur, unspecified: Secondary | ICD-10-CM | POA: Insufficient documentation

## 2013-08-12 DIAGNOSIS — Z8659 Personal history of other mental and behavioral disorders: Secondary | ICD-10-CM | POA: Insufficient documentation

## 2013-08-12 DIAGNOSIS — Y92410 Unspecified street and highway as the place of occurrence of the external cause: Secondary | ICD-10-CM | POA: Insufficient documentation

## 2013-08-12 DIAGNOSIS — Z23 Encounter for immunization: Secondary | ICD-10-CM | POA: Insufficient documentation

## 2013-08-12 MED ORDER — LIDOCAINE-EPINEPHRINE-TETRACAINE (LET) SOLUTION
3.0000 mL | Freq: Once | NASAL | Status: AC
  Administered 2013-08-12: 3 mL via TOPICAL
  Filled 2013-08-12: qty 3

## 2013-08-12 MED ORDER — TETANUS-DIPHTH-ACELL PERTUSSIS 5-2.5-18.5 LF-MCG/0.5 IM SUSP
0.5000 mL | Freq: Once | INTRAMUSCULAR | Status: AC
  Administered 2013-08-12: 0.5 mL via INTRAMUSCULAR
  Filled 2013-08-12: qty 0.5

## 2013-08-12 NOTE — ED Notes (Signed)
PT ambulated with baseline gait; VSS; A&Ox3; no signs of distress; respirations even and unlabored; skin warm and dry; no questions upon discharge.  
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
## 2013-08-12 NOTE — ED Notes (Signed)
Pt's foot wrapped in gauze.
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
## 2013-08-12 NOTE — Discharge Instructions (Signed)
Wound Care Wound care helps prevent pain and infection.  You may need a tetanus shot if:  You cannot remember when you had your last tetanus shot.  You have never had a tetanus shot.  The injury broke your skin. If you need a tetanus shot and you choose not to have one, you may get tetanus. Sickness from tetanus can be serious. HOME CARE   Only take medicine as told by your doctor.  Clean the wound daily with mild soap and water.  Change any bandages (dressings) as told by your doctor.  Put medicated cream and a bandage on the wound as told by your doctor.  Change the bandage if it gets wet, dirty, or starts to smell.  Take showers. Do not take baths, swim, or do anything that puts your wound under water.  Rest and raise (elevate) the wound until the pain and puffiness (swelling) are better.  Keep all doctor visits as told. GET HELP RIGHT AWAY IF:   Yellowish-white fluid (pus) comes from the wound.  Medicine does not lessen your pain.  There is a red streak going away from the wound.  You have a fever. MAKE SURE YOU:   Understand these instructions.  Will watch your condition.  Will get help right away if you are not doing well or get worse. Document Released: 03/30/2008 Document Revised: 09/13/2011 Document Reviewed: 10/25/2010 [employer] Patient Information 2014 [employer].  

## 2013-08-13 NOTE — ED Provider Notes (Signed)
Medical screening examination/treatment/procedure(s) were performed by non-physician practitioner and as supervising physician I was immediately available for consultation/collaboration.  EKG Interpretation   None         Donnae Michels, MD 08/13/13 1509 

## 2013-09-17 ENCOUNTER — Emergency Department (HOSPITAL_COMMUNITY)
Admission: EM | Admit: 2013-09-17 | Discharge: 2013-09-17 | Disposition: A | Discharge status: Home / Self Care | Payer: Medicaid Other | Source: Home / Self Care | Attending: Family Medicine | Admitting: Family Medicine

## 2013-09-17 ENCOUNTER — Encounter (HOSPITAL_COMMUNITY): Payer: Self-pay | Admitting: Emergency Medicine

## 2013-09-17 DIAGNOSIS — J329 Chronic sinusitis, unspecified: Secondary | ICD-10-CM

## 2013-09-17 DIAGNOSIS — J358 Other chronic diseases of tonsils and adenoids: Secondary | ICD-10-CM

## 2013-09-17 DIAGNOSIS — R0982 Postnasal drip: Secondary | ICD-10-CM

## 2013-09-17 MED ORDER — FLUTICASONE PROPIONATE 50 MCG/ACT NA SUSP: 2.0000 | Freq: Every day | NASAL | Status: DC

## 2013-09-17 NOTE — ED Notes (Signed)
C/o  Throat irritation with red bumps.  Pain around left tonsil.  Pain with swallowing.  On set last p.m.   Pt states "I smoked tobacco out of a bong for the first time shortly after symptoms occurred".   otc meds tried.

## 2013-09-17 NOTE — ED Provider Notes (Signed)
CSN: [id number]     Arrival date & time 09/17/13  1521 History   None    Chief Complaint  Patient presents with  . Sore Throat   (Consider location/radiation/quality/duration/timing/severity/associated sxs/prior Treatment) HPI Comments: 23 year old female presents for evaluation of sore throat and red bumps on the back of her tongue. This started yesterday after she tried smoking tobacco of a bong.  She has a history of tonsil stones and intermittently gets sore throat with that. She developed the sore throat that she assumed was related to the stones, but when she looked in her throat she saw  red bumps on  the back of her tongue. This alarmed her so she wanted to come be seen. She denies any other symptoms. She does not feel sick at this time. No sick contacts. He has a history of seasonal allergies and has been having some issues with that recently with postnasal drip, rhinitis, runny nose, intermittent mild dry cough.   Patient is a 23 y.o. female presenting with pharyngitis.  Sore Throat Pertinent negatives include no chest pain, no abdominal pain and no shortness of breath.    Past Medical History  Diagnosis Date  . ADHD (attention deficit hyperactivity disorder)   . Arthritis     knees and back  . Heart murmur    History reviewed. No pertinent past surgical history. Family History  Problem Relation Age of Onset  . Allergies Brother   . Asthma Mother   . Asthma Brother   . Heart disease Maternal Grandmother    History  Substance Use Topics  . Smoking status: Never Smoker   . Smokeless tobacco: Never Used  . Alcohol Use: No   OB History   Grav Para Term Preterm Abortions TAB SAB Ect Mult Living   0 0 0 0 0 0 0 0 0 0      Review of Systems  Constitutional: Negative for fever and chills.  HENT:       See history of present illness  Eyes: Negative for visual disturbance.  Respiratory: Negative for cough and shortness of breath.   Cardiovascular: Negative for chest  pain, palpitations and leg swelling.  Gastrointestinal: Negative for nausea, vomiting and abdominal pain.  Endocrine: Negative for polydipsia and polyuria.  Genitourinary: Negative for dysuria, urgency and frequency.  Musculoskeletal: Negative for arthralgias and myalgias.  Skin: Negative for rash.  Neurological: Negative for dizziness, weakness and light-headedness.    Allergies  Iodinated diagnostic agents  Home Medications   Current Outpatient Rx  Name  Route  Sig  Dispense  Refill  . acetaminophen (TYLENOL) 500 MG tablet   Oral   Take 500-1,000 mg by mouth every 8 (eight) hours as needed for mild pain.         Marland Kitchen etonogestrel (NEXPLANON) 68 MG IMPL implant   Subcutaneous   Inject 1 each into the skin once.         . fluticasone (FLONASE) 50 MCG/ACT nasal spray   Each Nare   Place 2 sprays into both nostrils daily.   16 g   5    There were no vitals taken for this visit. Physical Exam  Nursing note and vitals reviewed. Constitutional: She is oriented to person, place, and time. Vital signs are normal. She appears well-developed and well-nourished. No distress.  HENT:  Head: Normocephalic and atraumatic.  Cobblestoning noted in the posterior pharynx and posterior tongue. Bilateral tonsil stones noted as well.  Cardiovascular: Normal rate and regular rhythm.  Exam  reveals no gallop and no friction rub.   No murmur heard. Pulmonary/Chest: Effort normal and breath sounds normal. No respiratory distress. She has no wheezes. She has no rales.  Neurological: She is alert and oriented to person, place, and time. She has normal strength. Coordination normal.  Skin: Skin is warm and dry. No rash noted. She is not diaphoretic.  Psychiatric: She has a normal mood and affect. Judgment normal.    ED Course  Procedures (including critical care time) Labs Review Labs Reviewed - No data to display Imaging Review No results found.   MDM   1. Post-nasal drainage   2.  Tonsil stone    Treat with Flonase, also recommended daily antihistamine for the next week. Given the number for ENT on call Dr. Annalee GentaShoemaker, she can followup if she would like to pursue tonsillectomy because the tonsil stones have been bothering her so much   Meds ordered this encounter  Medications  . fluticasone (FLONASE) 50 MCG/ACT nasal spray    Sig: Place 2 sprays into both nostrils daily.    Dispense:  16 g    Refill:  5    Order Specific Question:  Supervising Provider    Answer:  Bradd CanaryKINDL, JAMES D [[id number]]       Graylon GoodZachary H Yoanna Jurczyk, PA-C 09/17/13 1730

## 2013-09-18 NOTE — ED Provider Notes (Signed)
Medical screening examination/treatment/procedure(s) were performed by resident physician or non-physician practitioner and as supervising physician I was immediately available for consultation/collaboration.   Elis Sauber DOUGLAS MD.   Lonnie Reth D Areliz Rothman, MD 09/18/13 0805 

## 2013-12-01 ENCOUNTER — Encounter (HOSPITAL_COMMUNITY): Payer: Self-pay | Admitting: Emergency Medicine

## 2013-12-01 ENCOUNTER — Emergency Department (HOSPITAL_COMMUNITY)
Admission: EM | Admit: 2013-12-01 | Discharge: 2013-12-01 | Disposition: A | Discharge status: Home / Self Care | Payer: Medicaid Other | Attending: Emergency Medicine | Admitting: Emergency Medicine

## 2013-12-01 DIAGNOSIS — IMO0002 Reserved for concepts with insufficient information to code with codable children: Secondary | ICD-10-CM

## 2013-12-01 DIAGNOSIS — Z79899 Other long term (current) drug therapy: Secondary | ICD-10-CM | POA: Insufficient documentation

## 2013-12-01 DIAGNOSIS — R011 Cardiac murmur, unspecified: Secondary | ICD-10-CM | POA: Insufficient documentation

## 2013-12-01 DIAGNOSIS — Z8659 Personal history of other mental and behavioral disorders: Secondary | ICD-10-CM | POA: Insufficient documentation

## 2013-12-01 DIAGNOSIS — M479 Spondylosis, unspecified: Secondary | ICD-10-CM | POA: Insufficient documentation

## 2013-12-01 DIAGNOSIS — M1710 Unilateral primary osteoarthritis, unspecified knee: Secondary | ICD-10-CM | POA: Insufficient documentation

## 2013-12-01 DIAGNOSIS — K2920 Alcoholic gastritis without bleeding: Secondary | ICD-10-CM | POA: Insufficient documentation

## 2013-12-01 LAB — COMPREHENSIVE METABOLIC PANEL
ALT: 8 U/L (ref 0–35)
AST: 15 U/L (ref 0–37)
Albumin: 4.2 g/dL (ref 3.5–5.2)
Alkaline Phosphatase: 58 U/L (ref 39–117)
BILIRUBIN TOTAL: 0.5 mg/dL (ref 0.3–1.2)
BUN: 7 mg/dL (ref 6–23)
CHLORIDE: 106 meq/L (ref 96–112)
CO2: 21 meq/L (ref 19–32)
Calcium: 9.7 mg/dL (ref 8.4–10.5)
Creatinine, Ser: 0.48 mg/dL — ABNORMAL LOW (ref 0.50–1.10)
GFR calc non Af Amer: >90 mL/min (ref >90)
GLUCOSE: 87 mg/dL (ref 70–99)
Potassium: 4.4 mEq/L (ref 3.7–5.3)
SODIUM: 143 meq/L (ref 137–147)
Total Protein: 7.8 g/dL (ref 6.0–8.3)

## 2013-12-01 LAB — CBC WITH DIFFERENTIAL/PLATELET
Basophils Absolute: 0 10*3/uL (ref 0.0–0.1)
Basophils Relative: 0 % (ref 0–1)
Eosinophils Absolute: 0 10*3/uL (ref 0.0–0.7)
Eosinophils Relative: 0 % (ref 0–5)
HCT: 30.2 % — ABNORMAL LOW (ref 36.0–46.0)
Hemoglobin: 10 g/dL — ABNORMAL LOW (ref 12.0–15.0)
LYMPHS ABS: 1.6 10*3/uL (ref 0.7–4.0)
LYMPHS PCT: 17 % (ref 12–46)
MCH: 27.2 pg (ref 26.0–34.0)
MCHC: 33.1 g/dL (ref 30.0–36.0)
MCV: 82.1 fL (ref 78.0–100.0)
MONOS PCT: 5 % (ref 3–12)
Monocytes Absolute: 0.5 10*3/uL (ref 0.1–1.0)
NEUTROS ABS: 7.3 10*3/uL (ref 1.7–7.7)
NEUTROS PCT: 78 % — AB (ref 43–77)
Platelets: 290 10*3/uL (ref 150–400)
RBC: 3.68 MIL/uL — AB (ref 3.87–5.11)
RDW: 15.3 % (ref 11.5–15.5)
WBC: 9.4 10*3/uL (ref 4.0–10.5)

## 2013-12-01 LAB — LIPASE, BLOOD: LIPASE: 16 U/L (ref 11–59)

## 2013-12-01 MED ORDER — FAMOTIDINE 20 MG PO TABS: 20.0000 mg | ORAL_TABLET | Freq: Two times a day (BID) | ORAL | Status: DC

## 2013-12-01 MED ORDER — PROMETHAZINE HCL 25 MG PO TABS: 25.0000 mg | ORAL_TABLET | Freq: Four times a day (QID) | ORAL | Status: DC | PRN

## 2013-12-01 MED ORDER — SODIUM CHLORIDE 0.9 % IV SOLN: INTRAVENOUS | Status: DC

## 2013-12-01 MED ORDER — SODIUM CHLORIDE 0.9 % IV BOLUS (SEPSIS)
1000.0000 mL | Freq: Once | INTRAVENOUS | Status: AC
  Administered 2013-12-01: 1000 mL via INTRAVENOUS

## 2013-12-01 MED ORDER — PANTOPRAZOLE SODIUM 40 MG IV SOLR
40.0000 mg | Freq: Once | INTRAVENOUS | Status: AC
  Administered 2013-12-01: 40 mg via INTRAVENOUS
  Filled 2013-12-01: qty 40

## 2013-12-01 MED ORDER — ONDANSETRON HCL 4 MG/2ML IJ SOLN
4.0000 mg | Freq: Once | INTRAMUSCULAR | Status: AC
  Administered 2013-12-01: 4 mg via INTRAVENOUS
  Filled 2013-12-01: qty 2

## 2013-12-01 NOTE — ED Notes (Signed)
Patient reports she drank vodka last night and thinks she had around 4-5 glasses of vodka. Pt states her friends told her that she vomited last night twice and twice today.

## 2013-12-01 NOTE — ED Notes (Signed)
The pt drank too much alcohol last pm vomiting since last pm.  She cannot remember the last pm hours.  She is unable to hold anything down all day.  She thinks she has a hangover.

## 2013-12-01 NOTE — ED Notes (Signed)
Pt given crackers and ginger ale to see how well tolerated.

## 2013-12-01 NOTE — ED Provider Notes (Signed)
CSN: [id number]     Arrival date & time 12/01/13  1747 History   First MD Initiated Contact with Patient 12/01/13 1804     Chief Complaint  Patient presents with  . Nausea     (Consider location/radiation/quality/duration/timing/severity/associated sxs/prior Treatment) The history is provided by the patient.   23 year old female. Admits to having too much to drink last night. Has been vomiting on and off today with some abdominal discomfort. Patient hasn't been able to get the vomiting to stop and not able to keep anything down. No vomiting of blood. Abdominal pain is achy in nature. It is not localized. No back pain.  Past Medical History  Diagnosis Date  . ADHD (attention deficit hyperactivity disorder)   . Arthritis     knees and back  . Heart murmur    History reviewed. No pertinent past surgical history. Family History  Problem Relation Age of Onset  . Allergies Brother   . Asthma Mother   . Asthma Brother   . Heart disease Maternal Grandmother    History  Substance Use Topics  . Smoking status: Never Smoker   . Smokeless tobacco: Never Used  . Alcohol Use: No   OB History   Grav Para Term Preterm Abortions TAB SAB Ect Mult Living   0 0 0 0 0 0 0 0 0 0      Review of Systems  Constitutional: Negative for fever.  HENT: Negative for congestion.   Eyes: Negative for redness.  Respiratory: Negative for shortness of breath.   Cardiovascular: Negative for chest pain.  Gastrointestinal: Positive for nausea, vomiting and abdominal pain. Negative for blood in stool.  Genitourinary: Negative for dysuria and hematuria.  Musculoskeletal: Negative for back pain.  Skin: Negative for rash.  Neurological: Negative for headaches.  Hematological: Does not bruise/bleed easily.  Psychiatric/Behavioral: Negative for confusion.      Allergies  Iodinated diagnostic agents  Home Medications   Prior to Admission medications   Medication Sig Start Date End Date Taking?  Authorizing Provider  acetaminophen (TYLENOL) 500 MG tablet Take 1,000 mg by mouth every 8 (eight) hours as needed for mild pain or headache.    Yes Historical Provider, MD  etonogestrel (NEXPLANON) 68 MG IMPL implant Inject 1 each into the skin once.   Yes Historical Provider, MD  famotidine (PEPCID) 20 MG tablet Take 1 tablet (20 mg total) by mouth 2 (two) times daily. 12/01/13   Vanetta Mulders, MD  promethazine (PHENERGAN) 25 MG tablet Take 1 tablet (25 mg total) by mouth every 6 (six) hours as needed for nausea or vomiting. 12/01/13   Vanetta Mulders, MD   BP 131/83  Pulse 88  Temp(Src) 99 F (37.2 C)  Resp 18  Ht 5\' 7"  (1.702 m)  Wt 127 lb (57.607 kg)  BMI 19.89 kg/m2  SpO2 100%  LMP 12/01/2013 Physical Exam  Nursing note and vitals reviewed. Constitutional: She is oriented to person, place, and time. She appears well-developed and well-nourished. No distress.  HENT:  Head: Normocephalic and atraumatic.  Mouth/Throat: Oropharynx is clear and moist.  Eyes: Conjunctivae and EOM are normal. Pupils are equal, round, and reactive to light.  Neck: Normal range of motion.  Cardiovascular: Normal rate and regular rhythm.   Pulmonary/Chest: Effort normal and breath sounds normal. No respiratory distress.  Abdominal: Soft. Bowel sounds are normal. There is no tenderness.  Musculoskeletal: Normal range of motion.  Neurological: She is alert and oriented to person, place, and time. No cranial nerve deficit.  She exhibits normal muscle tone. Coordination normal.  Skin: Skin is warm. No rash noted.    ED Course  Procedures (including critical care time) Labs Review Labs Reviewed  CBC WITH DIFFERENTIAL - Abnormal; Notable for the following:    RBC 3.68 (*)    Hemoglobin 10.0 (*)    HCT 30.2 (*)    Neutrophils Relative % 78 (*)    All other components within normal limits  COMPREHENSIVE METABOLIC PANEL - Abnormal; Notable for the following:    Creatinine, Ser 0.48 (*)    All other  components within normal limits  LIPASE, BLOOD   Results for orders placed during the hospital encounter of 12/01/13  CBC WITH DIFFERENTIAL      Result Value Ref Range   WBC 9.4  4.0 - 10.5 K/uL   RBC 3.68 (*) 3.87 - 5.11 MIL/uL   Hemoglobin 10.0 (*) 12.0 - 15.0 g/dL   HCT 78.230.2 (*) 95.636.0 - 21.346.0 %   MCV 82.1  78.0 - 100.0 fL   MCH 27.2  26.0 - 34.0 pg   MCHC 33.1  30.0 - 36.0 g/dL   RDW 08.615.3  57.811.5 - 46.915.5 %   Platelets 290  150 - 400 K/uL   Neutrophils Relative % 78 (*) 43 - 77 %   Neutro Abs 7.3  1.7 - 7.7 K/uL   Lymphocytes Relative 17  12 - 46 %   Lymphs Abs 1.6  0.7 - 4.0 K/uL   Monocytes Relative 5  3 - 12 %   Monocytes Absolute 0.5  0.1 - 1.0 K/uL   Eosinophils Relative 0  0 - 5 %   Eosinophils Absolute 0.0  0.0 - 0.7 K/uL   Basophils Relative 0  0 - 1 %   Basophils Absolute 0.0  0.0 - 0.1 K/uL  COMPREHENSIVE METABOLIC PANEL      Result Value Ref Range   Sodium 143  137 - 147 mEq/L   Potassium 4.4  3.7 - 5.3 mEq/L   Chloride 106  96 - 112 mEq/L   CO2 21  19 - 32 mEq/L   Glucose, Bld 87  70 - 99 mg/dL   BUN 7  6 - 23 mg/dL   Creatinine, Ser 6.290.48 (*) 0.50 - 1.10 mg/dL   Calcium 9.7  8.4 - 52.810.5 mg/dL   Total Protein 7.8  6.0 - 8.3 g/dL   Albumin 4.2  3.5 - 5.2 g/dL   AST 15  0 - 37 U/L   ALT 8  0 - 35 U/L   Alkaline Phosphatase 58  39 - 117 U/L   Total Bilirubin 0.5  0.3 - 1.2 mg/dL   GFR calc non Af Amer >90  >90 mL/min   GFR calc Af Amer >90  >90 mL/min  LIPASE, BLOOD      Result Value Ref Range   Lipase 16  11 - 59 U/L    Imaging Review No results found.   EKG Interpretation None      MDM   Final diagnoses:  Alcoholic gastritis    Patient improved significantly with the Protonix antinausea medicine and fluids. Feeling much better. Most likely and alcohol gastritis. Liver function tests without significant abnormalities. Patient nontoxic no acute distress. Will continue on Pepcid and Phenergan.    Vanetta MuldersScott Briceyda Abdullah, MD 12/01/13 2204

## 2013-12-01 NOTE — Discharge Instructions (Signed)
Labs were normal. Providers and Pepcid to keep your stomach,. Also provide you with Phenergan to help with the any persistent nausea and vomiting. Return for any new or worse symptoms or if not better by tomorrow.

## 2014-01-21 IMAGING — CR DG CHEST 2V
2 series · 2 of 2 positions shown · non-contrast
Comparison: None.

CLINICAL DATA: Shortness of breath, chest pressure for 2 months

EXAM:
CHEST  2 VIEW

[view not recorded (1 of 2)]
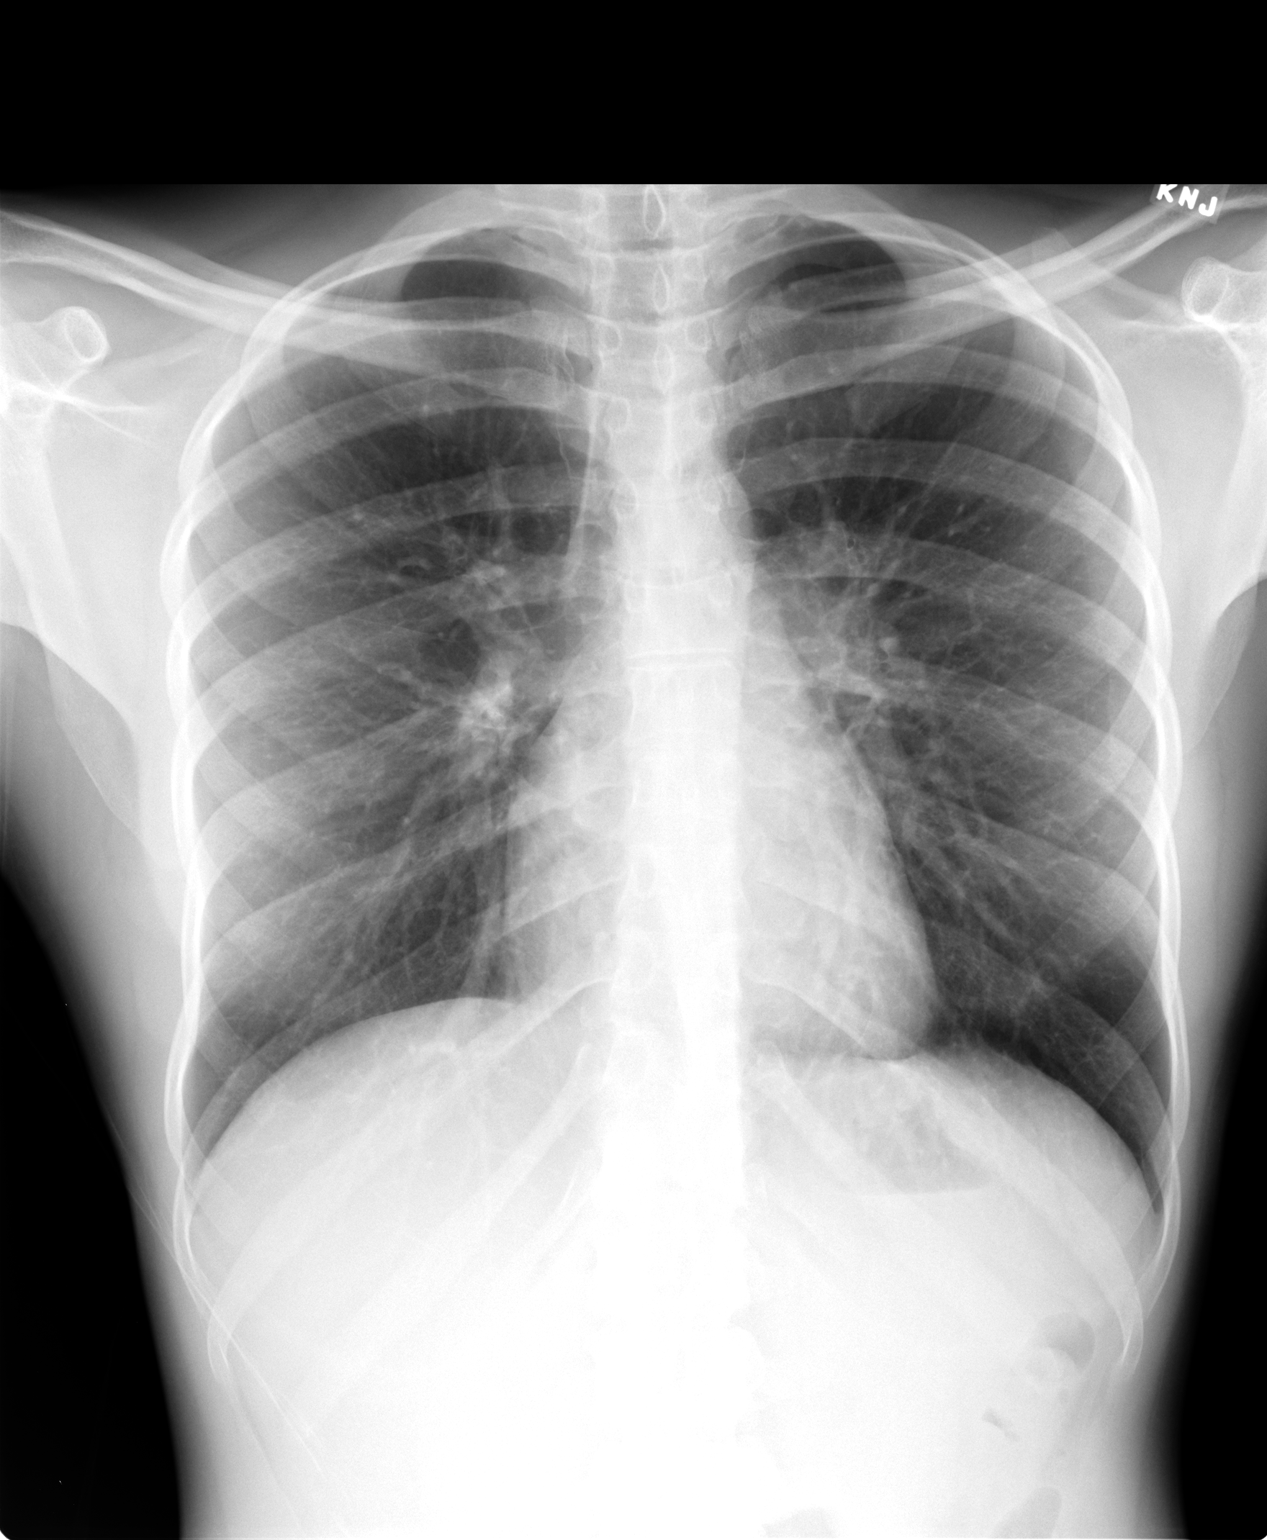

[view not recorded (2 of 2)]
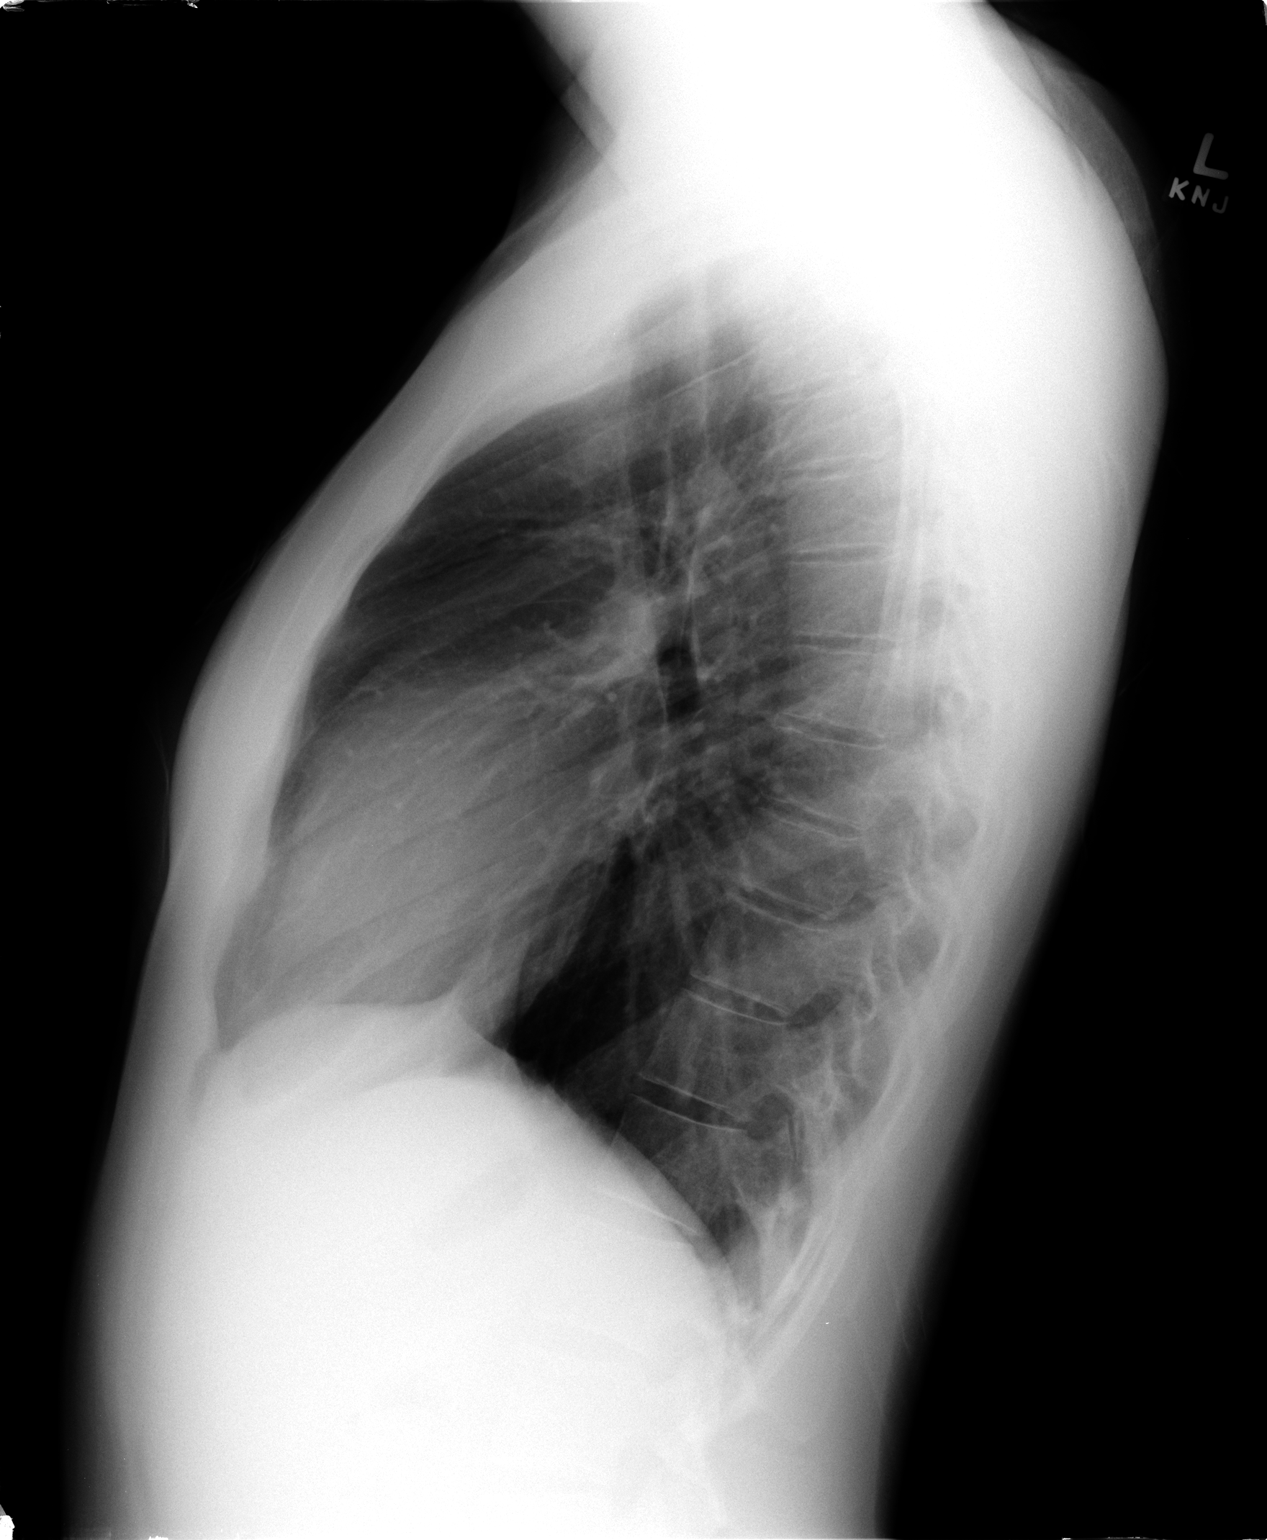

[2 of 2 positions shown; findings below may reference images not displayed]

FINDINGS: The heart size and mediastinal contours are within normal limits.
Both lungs are clear. The visualized skeletal structures are
unremarkable.
IMPRESSION: No active cardiopulmonary disease.

## 2014-01-24 ENCOUNTER — Telehealth: Payer: Self-pay | Admitting: Internal Medicine

## 2014-01-24 NOTE — Telephone Encounter (Signed)
DepoProvera's effect is longer... We may need 5 months after this coming dose (3 months for DepoProvera effect and 2 mo to wean off).

## 2014-01-24 NOTE — Telephone Encounter (Signed)
Called pt and advised her per Dr Charlean SanfilippoGherghe's result note. Pt stated if she gets the Depo now, then she will not take another one, so she can have 2 months off to do labs. Be advised.

## 2014-01-24 NOTE — Telephone Encounter (Signed)
Patient called stating that she will be having to remove her Nexplanon Is it ok for her to take Depo provera in the mean time? She does not remember what test Dr. Elvera LennoxGherghe is requesting, but she does know that 2 months prior she is to remove the Nexplanon   Please advise patient   Thank You

## 2014-01-24 NOTE — Telephone Encounter (Signed)
Please read note below and advise.  

## 2014-01-24 NOTE — Telephone Encounter (Signed)
We need to check: LH, FSH, estradiol, and testosterone 2 months AFTER being off all hormone formulations, including DepoProvera and Nexplanon. Please tell her to let me know around that time so I can order them.

## 2014-01-24 NOTE — Telephone Encounter (Signed)
Called pt and advised her. Pt understood. Pt to let us know when she is off hormones for 2 months.

## 2014-03-28 ENCOUNTER — Other Ambulatory Visit: Payer: Self-pay | Admitting: Nurse Practitioner

## 2014-03-28 DIAGNOSIS — R102 Pelvic and perineal pain: Secondary | ICD-10-CM

## 2014-03-30 ENCOUNTER — Emergency Department (HOSPITAL_COMMUNITY)
Admission: EM | Admit: 2014-03-30 | Discharge: 2014-03-30 | Disposition: A | Discharge status: Home / Self Care | Payer: Medicaid Other | Attending: Emergency Medicine | Admitting: Emergency Medicine

## 2014-03-30 ENCOUNTER — Encounter (HOSPITAL_COMMUNITY): Payer: Self-pay | Admitting: Emergency Medicine

## 2014-03-30 DIAGNOSIS — Z79899 Other long term (current) drug therapy: Secondary | ICD-10-CM | POA: Diagnosis not present

## 2014-03-30 DIAGNOSIS — Z3202 Encounter for pregnancy test, result negative: Secondary | ICD-10-CM | POA: Diagnosis not present

## 2014-03-30 DIAGNOSIS — R011 Cardiac murmur, unspecified: Secondary | ICD-10-CM | POA: Diagnosis not present

## 2014-03-30 DIAGNOSIS — R1032 Left lower quadrant pain: Secondary | ICD-10-CM | POA: Insufficient documentation

## 2014-03-30 DIAGNOSIS — M129 Arthropathy, unspecified: Secondary | ICD-10-CM | POA: Insufficient documentation

## 2014-03-30 DIAGNOSIS — A499 Bacterial infection, unspecified: Secondary | ICD-10-CM | POA: Diagnosis not present

## 2014-03-30 DIAGNOSIS — N760 Acute vaginitis: Secondary | ICD-10-CM | POA: Diagnosis not present

## 2014-03-30 DIAGNOSIS — B9689 Other specified bacterial agents as the cause of diseases classified elsewhere: Secondary | ICD-10-CM | POA: Insufficient documentation

## 2014-03-30 DIAGNOSIS — Z8659 Personal history of other mental and behavioral disorders: Secondary | ICD-10-CM | POA: Insufficient documentation

## 2014-03-30 LAB — COMPREHENSIVE METABOLIC PANEL
ALT: 10 U/L (ref 0–35)
AST: 17 U/L (ref 0–37)
Albumin: 4.3 g/dL (ref 3.5–5.2)
Alkaline Phosphatase: 53 U/L (ref 39–117)
Anion gap: 14 (ref 5–15)
BUN: 11 mg/dL (ref 6–23)
CO2: 22 meq/L (ref 19–32)
CREATININE: 0.53 mg/dL (ref 0.50–1.10)
Calcium: 9.1 mg/dL (ref 8.4–10.5)
Chloride: 102 mEq/L (ref 96–112)
GFR calc non Af Amer: >90 mL/min (ref >90)
GLUCOSE: 76 mg/dL (ref 70–99)
Potassium: 3.7 mEq/L (ref 3.7–5.3)
SODIUM: 138 meq/L (ref 137–147)
TOTAL PROTEIN: 8 g/dL (ref 6.0–8.3)
Total Bilirubin: 0.3 mg/dL (ref 0.3–1.2)

## 2014-03-30 LAB — CBC WITH DIFFERENTIAL/PLATELET
Basophils Absolute: 0 10*3/uL (ref 0.0–0.1)
Basophils Relative: 0 % (ref 0–1)
EOS ABS: 0 10*3/uL (ref 0.0–0.7)
EOS PCT: 1 % (ref 0–5)
HEMATOCRIT: 32.3 % — AB (ref 36.0–46.0)
Hemoglobin: 10.3 g/dL — ABNORMAL LOW (ref 12.0–15.0)
LYMPHS ABS: 2.3 10*3/uL (ref 0.7–4.0)
LYMPHS PCT: 31 % (ref 12–46)
MCH: 25.6 pg — AB (ref 26.0–34.0)
MCHC: 31.9 g/dL (ref 30.0–36.0)
MCV: 80.3 fL (ref 78.0–100.0)
Monocytes Absolute: 0.8 10*3/uL (ref 0.1–1.0)
Monocytes Relative: 11 % (ref 3–12)
Neutro Abs: 4.4 10*3/uL (ref 1.7–7.7)
Neutrophils Relative %: 57 % (ref 43–77)
PLATELETS: 266 10*3/uL (ref 150–400)
RBC: 4.02 MIL/uL (ref 3.87–5.11)
RDW: 17.6 % — ABNORMAL HIGH (ref 11.5–15.5)
WBC: 7.6 10*3/uL (ref 4.0–10.5)

## 2014-03-30 LAB — URINALYSIS, ROUTINE W REFLEX MICROSCOPIC
Bilirubin Urine: NEGATIVE
Glucose, UA: NEGATIVE mg/dL
Hgb urine dipstick: NEGATIVE
Ketones, ur: NEGATIVE mg/dL
LEUKOCYTES UA: NEGATIVE
Nitrite: NEGATIVE
PROTEIN: NEGATIVE mg/dL
Specific Gravity, Urine: 1.031 — ABNORMAL HIGH (ref 1.005–1.030)
UROBILINOGEN UA: 0.2 mg/dL (ref 0.0–1.0)
pH: 6 (ref 5.0–8.0)

## 2014-03-30 LAB — WET PREP, GENITAL: Trich, Wet Prep: NONE SEEN

## 2014-03-30 LAB — PREGNANCY, URINE: Preg Test, Ur: NEGATIVE

## 2014-03-30 LAB — LIPASE, BLOOD: Lipase: 19 U/L (ref 11–59)

## 2014-03-30 MED ORDER — METRONIDAZOLE 500 MG PO TABS
2000.0000 mg | ORAL_TABLET | Freq: Once | ORAL | Status: AC
  Administered 2014-03-30: 2000 mg via ORAL
  Filled 2014-03-30: qty 4

## 2014-03-30 NOTE — ED Notes (Signed)
Pt arrived to the ED with a complaint of lower abdominal pain.  Pt has been diagnosed with ovarian cysts.  Pt states that she has had pain previously.  Pt states that she has had two episodes today of pain.  Pain is located left lower abdomen

## 2014-03-30 NOTE — Discharge Instructions (Signed)
Bacterial Vaginosis Bacterial vaginosis is a vaginal infection that occurs when the normal balance of bacteria in the vagina is disrupted. It results from an overgrowth of certain bacteria. This is the most common vaginal infection in women of childbearing age. Treatment is important to prevent complications, especially in pregnant women, as it can cause a premature delivery. CAUSES  Bacterial vaginosis is caused by an increase in harmful bacteria that are normally present in smaller amounts in the vagina. Several different kinds of bacteria can cause bacterial vaginosis. However, the reason that the condition develops is not fully understood. RISK FACTORS Certain activities or behaviors can put you at an increased risk of developing bacterial vaginosis, including:  Having a new sex partner or multiple sex partners.  Douching.  Using an intrauterine device (IUD) for contraception. Women do not get bacterial vaginosis from toilet seats, bedding, swimming pools, or contact with objects around them. SIGNS AND SYMPTOMS  Some women with bacterial vaginosis have no signs or symptoms. Common symptoms include:  Grey vaginal discharge.  A fishlike odor with discharge, especially after sexual intercourse.  Itching or burning of the vagina and vulva.  Burning or pain with urination. DIAGNOSIS  Your health care provider will take a medical history and examine the vagina for signs of bacterial vaginosis. A sample of vaginal fluid may be taken. Your health care provider will look at this sample under a microscope to check for bacteria and abnormal cells. A vaginal pH test may also be done.  TREATMENT  Bacterial vaginosis may be treated with antibiotic medicines. These may be given in the form of a pill or a vaginal cream. A second round of antibiotics may be prescribed if the condition comes back after treatment.  HOME CARE INSTRUCTIONS   Only take over-the-counter or prescription medicines as  directed by your health care provider.  If antibiotic medicine was prescribed, take it as directed. Make sure you finish it even if you start to feel better.  Do not have sex until treatment is completed.  Tell all sexual partners that you have a vaginal infection. They should see their health care provider and be treated if they have problems, such as a mild rash or itching.  Practice safe sex by using condoms and only having one sex partner. SEEK MEDICAL CARE IF:   Your symptoms are not improving after 3 days of treatment.  You have increased discharge or pain.  You have a fever. MAKE SURE YOU:   Understand these instructions.  Will watch your condition.  Will get help right away if you are not doing well or get worse. FOR MORE INFORMATION  Centers for Disease Control and Prevention, Division of STD Prevention: [employer] American Sexual Health Association (ASHA): www.ashastd.org  Document Released: 06/21/2005 Document Revised: 04/11/2013 Document Reviewed: 01/31/2013 [facility] Patient Information 2015 [place], [place]. This information is not intended to replace advice given to you by your health care provider. Make sure you discuss any questions you have with your health care provider. Abdominal Pain Many things can cause belly (abdominal) pain. Most times, the belly pain is not dangerous. Many cases of belly pain can be watched and treated at home. HOME CARE   Do not take medicines that help you go poop (laxatives) unless told to by your doctor.  Only take medicine as told by your doctor.  Eat or drink as told by your doctor. Your doctor will tell you if you should be on a special diet. GET HELP IF:  You  do not know what is causing your belly pain.  You have belly pain while you are sick to your stomach (nauseous) or have runny poop (diarrhea).  You have pain while you pee or poop.  Your belly pain wakes you up at night.  You have belly pain that gets worse or  better when you eat.  You have belly pain that gets worse when you eat fatty foods.  You have a fever. GET HELP RIGHT AWAY IF:   The pain does not go away within 2 hours.  You keep throwing up (vomiting).  The pain changes and is only in the right or left part of the belly.  You have bloody or tarry looking poop. MAKE SURE YOU:   Understand these instructions.  Will watch your condition.  Will get help right away if you are not doing well or get worse. Document Released: 12/08/2007 Document Revised: 06/26/2013 Document Reviewed: 02/28/2013 [facility] Patient Information 2015 [place], [place]. This information is not intended to replace advice given to you by your health care provider. Make sure you discuss any questions you have with your health care provider.

## 2014-03-30 NOTE — ED Provider Notes (Signed)
CSN: [id number]     Arrival date & time 03/30/14  2115 History   First MD Initiated Contact with Patient 03/30/14 2133     Chief Complaint  Patient presents with  . Abdominal Pain     (Consider location/radiation/quality/duration/timing/severity/associated sxs/prior Treatment) HPI 23 y.o. Female with sharp left lower quadrant sharp pain. She state she has had this pain intermittently and has a history of pcos.  She usually take Percocet with relief. The pain has recurred 4 times in 2 days and this is unusual for her. She denies vaginal discharge. She states she's not currently sexually active. Denies any history of pelvic infections or sexually transmitted diseases. Her menstrual cycles are irregular. Past Medical History  Diagnosis Date  . ADHD (attention deficit hyperactivity disorder)   . Arthritis     knees and back  . Heart murmur    History reviewed. No pertinent past surgical history. Family History  Problem Relation Age of Onset  . Allergies Brother   . Asthma Mother   . Asthma Brother   . Heart disease Maternal Grandmother    History  Substance Use Topics  . Smoking status: Never Smoker   . Smokeless tobacco: Never Used  . Alcohol Use: No   OB History   Grav Para Term Preterm Abortions TAB SAB Ect Mult Living       Review of Systems  All other systems reviewed and are negative.     Allergies  Iodinated diagnostic agents  Home Medications   Prior to Admission medications   Medication Sig Start Date End Date Taking? Authorizing Provider  etonogestrel (NEXPLANON) 68 MG IMPL implant Inject 1 each into the skin once.   Yes Historical Provider, MD  meloxicam (MOBIC) 7.5 MG tablet Take 7.5 mg by mouth 2 (two) times daily as needed for pain.   Yes Historical Provider, MD  methocarbamol (ROBAXIN) 500 MG tablet Take 500 mg by mouth 2 (two) times daily.   Yes Historical Provider, MD   BP 132/66  Pulse 93  Temp(Src) 98.4 F (36.9 C) (Oral)   Resp 16  Ht  (1.702 m)  Wt 130 lb (58.968 kg)  BMI 20.36 kg/m2  SpO2 100%  LMP 02/13/2014 Physical Exam  Nursing note and vitals reviewed. Constitutional: She is oriented to person, place, and time. She appears well-developed and well-nourished.  HENT:  Head: Normocephalic and atraumatic.  Right Ear: External ear normal.  Left Ear: External ear normal.  Nose: Nose normal.  Mouth/Throat: Oropharynx is clear and moist.  Eyes: Conjunctivae and EOM are normal. Pupils are equal, round, and reactive to light.  Neck: Normal range of motion. Neck supple. No JVD present. No tracheal deviation present. No thyromegaly present.  Cardiovascular: Normal rate, regular rhythm, normal heart sounds and intact distal pulses.   Pulmonary/Chest: Effort normal and breath sounds normal. No respiratory distress. She has no wheezes.  Abdominal: Soft. Bowel sounds are normal. She exhibits no mass. There is no tenderness. There is no guarding.    Genitourinary: Vagina normal and uterus normal. Cervix exhibits no motion tenderness. Right adnexum displays no mass, no tenderness and no fullness. Left adnexum displays no mass, no tenderness and no fullness. No vaginal discharge found.  Musculoskeletal: Normal range of motion.  Lymphadenopathy:    She has no cervical adenopathy.  Neurological: She is alert and oriented to person, place, and time. She has normal reflexes. No cranial nerve deficit or sensory deficit.  Gait normal. GCS eye subscore is 4. GCS verbal subscore is 5. GCS motor subscore is 6.  Reflex Scores:      Bicep reflexes are 2+ on the right side and 2+ on the left side.      Patellar reflexes are 2+ on the right side and 2+ on the left side. Strength is 5/5 bilateral elbow flexor/extensors, wrist extension/flexion, intrinsic hand strength equal Bilateral hip flexion/extension 5/5, knee flexion/extension 5/5, ankle 5/5 flexion extension    Skin: Skin is warm and dry.  Psychiatric: She has a  normal mood and affect. Her behavior is normal. Judgment and thought content normal.    ED Course  Procedures (including critical care time) Labs Review Labs Reviewed  WET PREP, GENITAL - Abnormal; Notable for the following:    Yeast Wet Prep HPF POC FEW (*)    Clue Cells Wet Prep HPF POC RARE (*)    WBC, Wet Prep HPF POC RARE (*)    All other components within normal limits  CBC WITH DIFFERENTIAL - Abnormal; Notable for the following:    Hemoglobin 10.3 (*)    HCT 32.3 (*)    MCH 25.6 (*)    RDW 17.6 (*)    All other components within normal limits  URINALYSIS, ROUTINE W REFLEX MICROSCOPIC - Abnormal; Notable for the following:    Specific Gravity, Urine 1.031 (*)    All other components within normal limits  GC/CHLAMYDIA PROBE AMP  COMPREHENSIVE METABOLIC PANEL  LIPASE, BLOOD  PREGNANCY, URINE    Imaging Review No results found.   EKG Interpretation None      MDM   Final diagnoses:  Bacterial vaginosis    23 y.o. Female with pcos complaining of some intermittent sharp llq pain c.w. Cyst.  Exam here normal except some mild lower abdominal ttp.  She has an normal pelvic exam and has some wbc on wet prep.  Plan continue ibuprofen as needed.  Treat for bv and f/u with her pmd.     Hilario Quarry, MD 04/01/14 [id number]

## 2014-04-01 LAB — GC/CHLAMYDIA PROBE AMP
CT Probe RNA: NEGATIVE
GC Probe RNA: NEGATIVE

## 2014-04-02 ENCOUNTER — Ambulatory Visit
Admission: RE | Admit: 2014-04-02 | Discharge: 2014-04-02 | Disposition: A | Discharge status: Home / Self Care | Payer: Medicaid Other | Source: Ambulatory Visit | Attending: Nurse Practitioner | Admitting: Nurse Practitioner

## 2014-04-02 DIAGNOSIS — R102 Pelvic and perineal pain: Secondary | ICD-10-CM

## 2014-04-04 DIAGNOSIS — N83209 Unspecified ovarian cyst, unspecified side: Secondary | ICD-10-CM

## 2014-04-04 HISTORY — DX: Unspecified ovarian cyst, unspecified side: N83.209

## 2014-05-14 ENCOUNTER — Encounter (HOSPITAL_COMMUNITY): Payer: Self-pay | Admitting: *Deleted

## 2014-05-14 ENCOUNTER — Inpatient Hospital Stay (HOSPITAL_COMMUNITY)
Admission: AD | Admit: 2014-05-14 | Discharge: 2014-05-15 | Disposition: A | Discharge status: Home / Self Care | Payer: Medicaid Other | Source: Ambulatory Visit | Attending: Obstetrics & Gynecology | Admitting: Obstetrics & Gynecology

## 2014-05-14 DIAGNOSIS — R109 Unspecified abdominal pain: Secondary | ICD-10-CM | POA: Diagnosis present

## 2014-05-14 DIAGNOSIS — R300 Dysuria: Secondary | ICD-10-CM | POA: Insufficient documentation

## 2014-05-14 DIAGNOSIS — N8320 Unspecified ovarian cysts: Secondary | ICD-10-CM | POA: Insufficient documentation

## 2014-05-14 DIAGNOSIS — N83201 Unspecified ovarian cyst, right side: Secondary | ICD-10-CM

## 2014-05-14 HISTORY — DX: Polycystic ovarian syndrome: E28.2

## 2014-05-14 HISTORY — DX: Unspecified ovarian cyst, unspecified side: N83.209

## 2014-05-14 LAB — WET PREP, GENITAL
Clue Cells Wet Prep HPF POC: NONE SEEN
Trich, Wet Prep: NONE SEEN
YEAST WET PREP: NONE SEEN

## 2014-05-14 LAB — URINALYSIS, ROUTINE W REFLEX MICROSCOPIC
Bilirubin Urine: NEGATIVE
GLUCOSE, UA: NEGATIVE mg/dL
KETONES UR: 15 mg/dL — AB
LEUKOCYTES UA: NEGATIVE
NITRITE: NEGATIVE
PH: 7.5 (ref 5.0–8.0)
Protein, ur: NEGATIVE mg/dL
Specific Gravity, Urine: 1.025 (ref 1.005–1.030)
Urobilinogen, UA: 2 mg/dL — ABNORMAL HIGH (ref 0.0–1.0)

## 2014-05-14 LAB — URINE MICROSCOPIC-ADD ON

## 2014-05-14 LAB — POCT PREGNANCY, URINE: PREG TEST UR: NEGATIVE

## 2014-05-14 MED ORDER — NITROFURANTOIN MONOHYD MACRO 100 MG PO CAPS: 100.0000 mg | ORAL_CAPSULE | Freq: Two times a day (BID) | ORAL | Status: DC

## 2014-05-14 NOTE — MAU Note (Signed)
Pt reports she has a history of PCOS and a 6cm cyst on her rt ovary. States she has been hurting for the last 3 days. LMP 10/29

## 2014-05-14 NOTE — MAU Provider Note (Signed)
History     CSN: [id number]  Arrival date and time: 05/14/14 2046   First Provider Initiated Contact with Patient 05/14/14 2226      Chief Complaint  Patient presents with  . Abdominal Pain   HPI  Pt is a 23 yo female here with report of pain on entire lower abdomen that started yesterday.  Pain is described as an "achy" pain.  Reports dysuria and pain with sudden movement.  Pain is rated a 5/10 and reported "tolerable" by patient.  Declines medication treatment at this time.  Patient was diagnosed with right ovarian cyst on 04/02/14:  Right ovarian 5.8 x 4.7 x 4.2 cm complex cyst, possibly hemorrhagic cyst. This is almost certainly benign, but follow up ultrasound is recommended in 1 year.    Currently on Nexplanon.  +brown discharge, +odor.  Reports not sexually active since October 26.  [facility] checked for infections and [facility]    Past Medical History  Diagnosis Date  . ADHD (attention deficit hyperactivity disorder)   . Arthritis     knees and back  . Heart murmur   . PCOS (polycystic ovarian syndrome)   . Ovarian cyst Oct 2015    Past Surgical History  Procedure Laterality Date  . No past surgeries      Family History  Problem Relation Age of Onset  . Allergies Brother   . Asthma Mother   . Asthma Brother   . Heart disease Maternal Grandmother     History  Substance Use Topics  . Smoking status: Never Smoker   . Smokeless tobacco: Never Used  . Alcohol Use: No    Allergies:  Allergies  Allergen Reactions  . Iodinated Diagnostic Agents Itching    Itching and hives.     Prescriptions prior to admission  Medication Sig Dispense Refill Last Dose  . ferrous sulfate 325 (65 FE) MG tablet Take 325 mg by mouth daily with breakfast.   Past Week at Unknown time  . ibuprofen (ADVIL,MOTRIN) 200 MG tablet Take 800-1,000 mg by mouth every 6 (six) hours as needed for moderate pain.   Past Week at Unknown time  .  meloxicam (MOBIC) 7.5 MG tablet Take 7.5 mg by mouth 2 (two) times daily as needed for pain.   Past Week at Unknown time  . methocarbamol (ROBAXIN) 500 MG tablet Take 500 mg by mouth 2 (two) times daily as needed (back pain).    Past Week at Unknown time  . naproxen sodium (ANAPROX) 220 MG tablet Take 440 mg by mouth daily as needed (pain).   05/13/2014 at Unknown time  . Omega-3 Fatty Acids (FISH OIL PO) Take 1 capsule by mouth daily.   Past Week at Unknown time  . etonogestrel (NEXPLANON) 68 MG IMPL implant Inject 1 each into the skin once. Placed August 27, 2011.   03/30/2014 at Unknown time    Review of Systems  Gastrointestinal: Positive for abdominal pain. Negative for nausea and vomiting.  Genitourinary: Positive for dysuria. Negative for urgency.       Brown discharge  All other systems reviewed and are negative.  Physical Exam   Blood pressure 120/78, pulse 100, temperature 98.6 F (37 C), temperature source Oral, resp. rate 16, height 5\' 7"  (1.702 m), weight 58.968 kg (130 lb), last menstrual period 05/02/2014, SpO2 100 %.  Physical Exam  Constitutional: She is oriented to person, place, and time. She appears well-developed and well-nourished.  HENT:  Head:  Normocephalic.  Eyes: Pupils are equal, round, and reactive to light.  Neck: Normal range of motion. Neck supple.  Cardiovascular: Normal rate, regular rhythm and normal heart sounds.   Respiratory: Effort normal and breath sounds normal.  GI: Soft. There is no tenderness.  Genitourinary: Cervix exhibits no motion tenderness and no discharge. Right adnexum displays tenderness. Right adnexum displays no mass. Left adnexum displays no tenderness. Vaginal discharge: dark brown discharge.  Negative cervical motion tenderness  Musculoskeletal: Normal range of motion. She exhibits no edema.  Neurological: She is alert and oriented to person, place, and time. She has normal reflexes.  Skin: Skin is warm and dry.    MAU Course   Procedures  Results for orders placed or performed during the hospital encounter of 05/14/14 (from the past 24 hour(s))  Urinalysis, Routine w reflex microscopic     Status: Abnormal   Collection Time: 05/14/14  8:55 PM  Result Value Ref Range   Color, Urine YELLOW YELLOW   APPearance CLEAR CLEAR   Specific Gravity, Urine 1.025 1.005 - 1.030   pH 7.5 5.0 - 8.0   Glucose, UA NEGATIVE NEGATIVE mg/dL   Hgb urine dipstick MODERATE (A) NEGATIVE   Bilirubin Urine NEGATIVE NEGATIVE   Ketones, ur 15 (A) NEGATIVE mg/dL   Protein, ur NEGATIVE NEGATIVE mg/dL   Urobilinogen, UA 2.0 (H) 0.0 - 1.0 mg/dL   Nitrite NEGATIVE NEGATIVE   Leukocytes, UA NEGATIVE NEGATIVE  Urine microscopic-add on     Status: None   Collection Time: 05/14/14  8:55 PM  Result Value Ref Range   Squamous Epithelial / LPF RARE RARE   WBC, UA 0-2 <3 WBC/hpf   RBC / HPF 3-6 <3 RBC/hpf   Bacteria, UA RARE RARE   Urine-Other MUCOUS PRESENT   Pregnancy, urine POC     Status: None   Collection Time: 05/14/14  9:05 PM  Result Value Ref Range   Preg Test, Ur NEGATIVE NEGATIVE  Wet prep, genital     Status: Abnormal   Collection Time: 05/14/14 11:11 PM  Result Value Ref Range   Yeast Wet Prep HPF POC NONE SEEN NONE SEEN   Trich, Wet Prep NONE SEEN NONE SEEN   Clue Cells Wet Prep HPF POC NONE SEEN NONE SEEN   WBC, Wet Prep HPF POC MANY (A) NONE SEEN   2350 Consulted with Dr. Debroah LoopArnold > reviewed HPI/exam/prior ultrasound > pt to follow-up in clinic in two weeks  Assessment and Plan  Ovarian Cyst Dysuria  Plan: Continue current pain medication RX Macrobid 100 mg BID x 7 days Urine culture sent Follow-up in office in two weeks  KARIM, [facility] At [facility] [email or web address] N 05/14/2014, 10:29 PM

## 2014-05-15 DIAGNOSIS — N8320 Unspecified ovarian cysts: Secondary | ICD-10-CM

## 2014-05-15 NOTE — Discharge Instructions (Signed)
Ovarian Cyst An ovarian cyst is a fluid-filled sac that forms on an ovary. The ovaries are small organs that produce eggs in women. Various types of cysts can form on the ovaries. Most are not cancerous. Many do not cause problems, and they often go away on their own. Some may cause symptoms and require treatment. Common types of ovarian cysts include:  Functional cysts--These cysts may occur every month during the menstrual cycle. This is normal. The cysts usually go away with the next menstrual cycle if the woman does not get pregnant. Usually, there are no symptoms with a functional cyst.  Endometrioma cysts--These cysts form from the tissue that lines the uterus. They are also called "chocolate cysts" because they become filled with blood that turns brown. This type of cyst can cause pain in the lower abdomen during intercourse and with your menstrual period.  Cystadenoma cysts--This type develops from the cells on the outside of the ovary. These cysts can get very big and cause lower abdomen pain and pain with intercourse. This type of cyst can twist on itself, cut off its blood supply, and cause severe pain. It can also easily rupture and cause a lot of pain.  Dermoid cysts--This type of cyst is sometimes found in both ovaries. These cysts may contain different kinds of body tissue, such as skin, teeth, hair, or cartilage. They usually do not cause symptoms unless they get very big.  Theca lutein cysts--These cysts occur when too much of a certain hormone (human chorionic gonadotropin) is produced and overstimulates the ovaries to produce an egg. This is most common after procedures used to assist with the conception of a baby (in vitro fertilization). CAUSES   Fertility drugs can cause a condition in which multiple large cysts are formed on the ovaries. This is called ovarian hyperstimulation syndrome.  A condition called polycystic ovary syndrome can cause hormonal imbalances that can lead to  nonfunctional ovarian cysts. SIGNS AND SYMPTOMS  Many ovarian cysts do not cause symptoms. If symptoms are present, they may include:  Pelvic pain or pressure.  Pain in the lower abdomen.  Pain during sexual intercourse.  Increasing girth (swelling) of the abdomen.  Abnormal menstrual periods.  Increasing pain with menstrual periods.  Stopping having menstrual periods without being pregnant. DIAGNOSIS  These cysts are commonly found during a routine or annual pelvic exam. Tests may be ordered to find out more about the cyst. These tests may include:  Ultrasound.  X-ray of the pelvis.  CT scan.  MRI.  Blood tests. TREATMENT  Many ovarian cysts go away on their own without treatment. Your health care provider may want to check your cyst regularly for 2-3 months to see if it changes. For women in menopause, it is particularly important to monitor a cyst closely because of the higher rate of ovarian cancer in menopausal women. When treatment is needed, it may include any of the following:  A procedure to drain the cyst (aspiration). This may be done using a long needle and ultrasound. It can also be done through a laparoscopic procedure. This involves using a thin, lighted tube with a tiny camera on the end (laparoscope) inserted through a small incision.  Surgery to remove the whole cyst. This may be done using laparoscopic surgery or an open surgery involving a larger incision in the lower abdomen.  Hormone treatment or birth control pills. These methods are sometimes used to help dissolve a cyst. HOME CARE INSTRUCTIONS   Only take over-the-counter   or prescription medicines as directed by your health care provider.  Follow up with your health care provider as directed.  Get regular pelvic exams and Pap tests. SEEK MEDICAL CARE IF:   Your periods are late, irregular, or painful, or they stop.  Your pelvic pain or abdominal pain does not go away.  Your abdomen becomes  larger or swollen.  You have pressure on your bladder or trouble emptying your bladder completely.  You have pain during sexual intercourse.  You have feelings of fullness, pressure, or discomfort in your stomach.  You lose weight for no apparent reason.  You feel generally ill.  You become constipated.  You lose your appetite.  You develop acne.  You have an increase in body and facial hair.  You are gaining weight, without changing your exercise and eating habits.  You think you are pregnant. SEEK IMMEDIATE MEDICAL CARE IF:   You have increasing abdominal pain.  You feel sick to your stomach (nauseous), and you throw up (vomit).  You develop a fever that comes on suddenly.  You have abdominal pain during a bowel movement.  Your menstrual periods become heavier than usual. MAKE SURE YOU:  Understand these instructions.  Will watch your condition.  Will get help right away if you are not doing well or get worse. Document Released: 06/21/2005 Document Revised: 06/26/2013 Document Reviewed: 02/26/2013 [employer] Patient Information 2015 [employer]. This information is not intended to replace advice given to you by your health care provider. Make sure you discuss any questions you have with your health care provider.  

## 2014-05-16 LAB — URINE CULTURE

## 2014-05-29 ENCOUNTER — Telehealth: Payer: Self-pay | Admitting: *Deleted

## 2014-05-29 ENCOUNTER — Ambulatory Visit: Payer: Medicaid Other | Admitting: Obstetrics & Gynecology

## 2014-05-29 NOTE — Telephone Encounter (Signed)
Called patient, she states she cancelled appointment and move it to 06/06/14.

## 2014-05-30 ENCOUNTER — Encounter: Payer: Self-pay | Admitting: Obstetrics & Gynecology

## 2014-06-06 ENCOUNTER — Ambulatory Visit (INDEPENDENT_AMBULATORY_CARE_PROVIDER_SITE_OTHER): Payer: Medicaid Other | Admitting: Obstetrics & Gynecology

## 2014-06-06 ENCOUNTER — Encounter: Payer: Self-pay | Admitting: Obstetrics & Gynecology

## 2014-06-06 ENCOUNTER — Telehealth: Payer: Self-pay | Admitting: Internal Medicine

## 2014-06-06 VITALS — BP 119/68 | HR 90 | Temp 98.6°F | Ht 67.0 in | Wt 124.2 lb

## 2014-06-06 DIAGNOSIS — N8320 Unspecified ovarian cysts: Secondary | ICD-10-CM

## 2014-06-06 DIAGNOSIS — N83201 Unspecified ovarian cyst, right side: Secondary | ICD-10-CM

## 2014-06-06 NOTE — Telephone Encounter (Signed)
Called pt and advised her per Dr Gherghe's note. Pt understood.  

## 2014-06-06 NOTE — Patient Instructions (Signed)
Polycystic Ovarian Syndrome  Polycystic ovarian syndrome (PCOS) is a common hormonal disorder among women of reproductive age. Most women with PCOS grow many small cysts on their ovaries. PCOS can cause problems with your periods and make it difficult to get pregnant. It can also cause an increased risk of miscarriage with pregnancy. If left untreated, PCOS can lead to serious health problems, such as diabetes and heart disease.  CAUSES  The cause of PCOS is not fully understood, but genetics may be a factor.  SIGNS AND SYMPTOMS    Infrequent or no menstrual periods.    Inability to get pregnant (infertility) because of not ovulating.    Increased growth of hair on the face, chest, stomach, back, thumbs, thighs, or toes.    Acne, oily skin, or dandruff.    Pelvic pain.    Weight gain or obesity, usually carrying extra weight around the waist.    Type 2 diabetes.    High cholesterol.    High blood pressure.    Female-pattern baldness or thinning hair.    Patches of thickened and dark brown or black skin on the neck, arms, breasts, or thighs.    Tiny excess flaps of skin (skin tags) in the armpits or neck area.    Excessive snoring and having breathing stop at times while asleep (sleep apnea).    Deepening of the voice.    Gestational diabetes when pregnant.   DIAGNOSIS   There is no single test to diagnose PCOS.    Your health care provider will:    Take a medical history.    Perform a pelvic exam.    Have ultrasonography done.    Check your female and female hormone levels.    Measure glucose or sugar levels in the blood.    Do other blood tests.    If you are producing too many female hormones, your health care provider will make sure it is from PCOS. At the physical exam, your health care provider will want to evaluate the areas of increased hair growth. Try to allow natural hair growth for a few days before the visit.    During a pelvic exam, the ovaries may be  enlarged or swollen because of the increased number of small cysts. This can be seen more easily by using vaginal ultrasonography or screening to examine the ovaries and lining of the uterus (endometrium) for cysts. The uterine lining may become thicker if you have not been having a regular period.   TREATMENT   Because there is no cure for PCOS, it needs to be managed to prevent problems. Treatments are based on your symptoms. Treatment is also based on whether you want to have a baby or whether you need contraception.   Treatment may include:    Progesterone hormone to start a menstrual period.    Birth control pills to make you have regular menstrual periods.    Medicines to make you ovulate, if you want to get pregnant.    Medicines to control your insulin.    Medicine to control your blood pressure.    Medicine and diet to control your high cholesterol and triglycerides in your blood.   Medicine to reduce excessive hair growth.   Surgery, making small holes in the ovary, to decrease the amount of female hormone production. This is done through a long, lighted tube (laparoscope) placed into the pelvis through a tiny incision in the lower abdomen.   HOME CARE INSTRUCTIONS   Only   take over-the-counter or prescription medicine as directed by your health care provider.   Pay attention to the foods you eat and your activity levels. This can help reduce the effects of PCOS.   Keep your weight under control.   Eat foods that are low in carbohydrate and high in fiber.   Exercise regularly.  SEEK MEDICAL CARE IF:   Your symptoms do not get better with medicine.   You have new symptoms.  Document Released: 10/15/2004 Document Revised: 04/11/2013 Document Reviewed: 12/07/2012  [employer] Patient Information 2015 [employer]. This information is not intended to replace advice given to you by your health care provider. Make sure you discuss any questions you have with your health care provider.

## 2014-06-06 NOTE — Telephone Encounter (Signed)
No, she was going to have it removed (her preference) and I recommended to come back for labs in 2 months after this. She does not need to have it removed only with the purpose of having the PCOS labs checked, if she feels this is working well for her.

## 2014-06-06 NOTE — Telephone Encounter (Signed)
Patient states she would like Dr. Althea CharonGherghes opinion on removal of her birth control This was something Dr. Elvera LennoxGherghe recommended for her due to her PCOS  [facility] that [facility]  Her removal is scheduled for tomorrow    Please advise patient    Thank you

## 2014-06-06 NOTE — Telephone Encounter (Signed)
Please read note below and advise.  

## 2014-06-06 NOTE — Progress Notes (Signed)
Subjective:     Patient ID: Sherri Welch, female   DOB: 24-Jan-1991, 23 y.o.   MRN: [id number]  HPI Pt was seen in Sept and diagnosed with a complex ov cyst.  She reports that the pain lasted for 1 week then resolved and she has had no further pain. She reports that her 'endocrinologist wants her Nexplanon removed 2 weeks prior to her next visit to check her PCOS labs.'  Review of Systems     Objective:   Physical Exam BP 119/68 mmHg  Pulse 90  Temp(Src) 98.6 F (37 C)  Ht 5\' 7"  (1.702 m)  Wt 124 lb 3.2 oz (56.337 kg)  BMI 19.45 kg/m2  LMP 05/26/2014 Pt in NAD Abd; soft, NT, ND GU: EGBUS: no lesions Vagina: no blood in vault Cervix: no lesion; no mucopurulent d/c Uterus: small, mobile Adnexa: no masses; non tender; NO masses palpated      04/02/2014 CLINICAL DATA: Left lower quadrant pain.  EXAM: TRANSABDOMINAL AND TRANSVAGINAL ULTRASOUND OF PELVIS  TECHNIQUE: Both transabdominal and transvaginal ultrasound examinations of the pelvis were performed. Transabdominal technique was performed for global imaging of the pelvis including uterus, ovaries, adnexal regions, and pelvic cul-de-sac. It was necessary to proceed with endovaginal exam following the transabdominal exam to visualize the endometrium.  COMPARISON: None  FINDINGS: Uterus  Measurements: 8.4 x 4.9 x 5.5 cm No fibroids or other mass visualized.  Endometrium  Thickness: 3.0 mm. No focal abnormality visualized.  Right ovary  Measurements: 6.2 x 5.2 x 6.7 cm. 5.8 x 4.7 x 4.2 cm complex cyst, possibly hemorrhagic cyst.  Left ovary  Measurements: 1.1 x 2.5 x 1.6 cm. Tiny follicles and questionable small collapsed cyst. No significant mass.  Other findings  Small amount free pelvic fluid.  IMPRESSION: Right ovarian 5.8 x 4.7 x 4.2 cm complex cyst, possibly hemorrhagic cyst. This is almost certainly benign, but follow up ultrasound is recommended in 1 year according to the  Society of Radiologists in Ultrasound2010 Consensus Conference Statement Grier Rocher(D Levine et al. Management of Asymptomatic Ovarian and Other Adnexal Cysts Imaged at US: Society of Radiologists in Ultrasound Consensus Conference Statement 2010. Radiology 256 (Sept 2010): 943-954.).     Assessment:     right ov cyst  PCOS- rec AGAINST having the Nexplanon removed. Pt is [age]years old.  Does not desire a pregnancy at present and any sx can be managed accordingly.  There is no benefit to having her PCOS labs check off the Nexplanon.  Pt asked to have her endocrinologist call me with questions or concerns.    Plan:     F/u in 1 year Rec NOT removing Nexplanon to have labs checked
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
## 2014-08-09 ENCOUNTER — Ambulatory Visit: Payer: Medicaid Other | Admitting: Internal Medicine

## 2015-01-26 ENCOUNTER — Emergency Department (HOSPITAL_COMMUNITY)
Admission: EM | Admit: 2015-01-26 | Discharge: 2015-01-26 | Disposition: A | Discharge status: Home / Self Care | Payer: Medicaid Other | Attending: Emergency Medicine | Admitting: Emergency Medicine

## 2015-01-26 ENCOUNTER — Encounter (HOSPITAL_COMMUNITY): Payer: Self-pay | Admitting: Emergency Medicine

## 2015-01-26 DIAGNOSIS — M159 Polyosteoarthritis, unspecified: Secondary | ICD-10-CM | POA: Insufficient documentation

## 2015-01-26 DIAGNOSIS — Y9289 Other specified places as the place of occurrence of the external cause: Secondary | ICD-10-CM | POA: Insufficient documentation

## 2015-01-26 DIAGNOSIS — Z3202 Encounter for pregnancy test, result negative: Secondary | ICD-10-CM | POA: Insufficient documentation

## 2015-01-26 DIAGNOSIS — Z8639 Personal history of other endocrine, nutritional and metabolic disease: Secondary | ICD-10-CM | POA: Insufficient documentation

## 2015-01-26 DIAGNOSIS — Z8742 Personal history of other diseases of the female genital tract: Secondary | ICD-10-CM | POA: Insufficient documentation

## 2015-01-26 DIAGNOSIS — Y9389 Activity, other specified: Secondary | ICD-10-CM | POA: Insufficient documentation

## 2015-01-26 DIAGNOSIS — R011 Cardiac murmur, unspecified: Secondary | ICD-10-CM | POA: Insufficient documentation

## 2015-01-26 DIAGNOSIS — Y998 Other external cause status: Secondary | ICD-10-CM | POA: Insufficient documentation

## 2015-01-26 DIAGNOSIS — S161XXA Strain of muscle, fascia and tendon at neck level, initial encounter: Secondary | ICD-10-CM | POA: Insufficient documentation

## 2015-01-26 DIAGNOSIS — Z79899 Other long term (current) drug therapy: Secondary | ICD-10-CM | POA: Insufficient documentation

## 2015-01-26 DIAGNOSIS — Z8659 Personal history of other mental and behavioral disorders: Secondary | ICD-10-CM | POA: Insufficient documentation

## 2015-01-26 DIAGNOSIS — R110 Nausea: Secondary | ICD-10-CM | POA: Insufficient documentation

## 2015-01-26 LAB — CBC
HEMATOCRIT: 31.9 % — AB (ref 36.0–46.0)
Hemoglobin: 10.2 g/dL — ABNORMAL LOW (ref 12.0–15.0)
MCH: 25.4 pg — ABNORMAL LOW (ref 26.0–34.0)
MCHC: 32 g/dL (ref 30.0–36.0)
MCV: 79.4 fL (ref 78.0–100.0)
Platelets: 341 10*3/uL (ref 150–400)
RBC: 4.02 MIL/uL (ref 3.87–5.11)
RDW: 17 % — AB (ref 11.5–15.5)
WBC: 10.4 10*3/uL (ref 4.0–10.5)

## 2015-01-26 LAB — COMPREHENSIVE METABOLIC PANEL
ALT: 11 U/L — AB (ref 14–54)
ANION GAP: 6 (ref 5–15)
AST: 17 U/L (ref 15–41)
Albumin: 4.1 g/dL (ref 3.5–5.0)
Alkaline Phosphatase: 47 U/L (ref 38–126)
BUN: 7 mg/dL (ref 6–20)
CALCIUM: 9.4 mg/dL (ref 8.9–10.3)
CO2: 26 mmol/L (ref 22–32)
CREATININE: 0.55 mg/dL (ref 0.44–1.00)
Chloride: 109 mmol/L (ref 101–111)
GLUCOSE: 106 mg/dL — AB (ref 65–99)
Potassium: 3.5 mmol/L (ref 3.5–5.1)
Sodium: 141 mmol/L (ref 135–145)
TOTAL PROTEIN: 7.4 g/dL (ref 6.5–8.1)
Total Bilirubin: 0.4 mg/dL (ref 0.3–1.2)

## 2015-01-26 LAB — I-STAT BETA HCG BLOOD, ED (MC, WL, AP ONLY): I-stat hCG, quantitative: <5 m[IU]/mL (ref <5)

## 2015-01-26 MED ORDER — ORPHENADRINE CITRATE ER 100 MG PO TB12: 100.0000 mg | ORAL_TABLET | Freq: Two times a day (BID) | ORAL | Status: DC

## 2015-01-26 MED ORDER — ONDANSETRON 4 MG PO TBDP
ORAL_TABLET | ORAL | Status: AC
  Filled 2015-01-26: qty 1

## 2015-01-26 MED ORDER — ONDANSETRON 4 MG PO TBDP
4.0000 mg | ORAL_TABLET | Freq: Once | ORAL | Status: AC | PRN
  Administered 2015-01-26: 4 mg via ORAL
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
## 2015-01-26 NOTE — Discharge Instructions (Signed)
Cervical Sprain °A cervical sprain is an injury in the neck in which the strong, fibrous tissues (ligaments) that connect your neck bones stretch or tear. Cervical sprains can range from mild to severe. Severe cervical sprains can cause the neck vertebrae to be unstable. This can lead to damage of the spinal cord and can result in serious nervous system problems. The amount of time it takes for a cervical sprain to get better depends on the cause and extent of the injury. Most cervical sprains heal in 1 to 3 weeks. °CAUSES  °Severe cervical sprains may be caused by:  °· Contact sport injuries (such as from football, rugby, wrestling, hockey, auto racing, gymnastics, diving, martial arts, or boxing).   °· Motor vehicle collisions.   °· Whiplash injuries. This is an injury from a sudden forward and backward whipping movement of the head and neck.  °· Falls.   °Mild cervical sprains may be caused by:  °· Being in an awkward position, such as while cradling a telephone between your ear and shoulder.   °· Sitting in a chair that does not offer proper support.   °· Working at a poorly designed computer station.   °· Looking up or down for long periods of time.   °SYMPTOMS  °· Pain, soreness, stiffness, or a burning sensation in the front, back, or sides of the neck. This discomfort may develop immediately after the injury or slowly, 24 hours or more after the injury.   °· Pain or tenderness directly in the middle of the back of the neck.   °· Shoulder or upper back pain.   °· Limited ability to move the neck.   °· Headache.   °· Dizziness.   °· Weakness, numbness, or tingling in the hands or arms.   °· Muscle spasms.   °· Difficulty swallowing or chewing.   °· Tenderness and swelling of the neck.   °DIAGNOSIS  °Most of the time your health care provider can diagnose a cervical sprain by taking your history and doing a physical exam. Your health care provider will ask about previous neck injuries and any known neck  problems, such as arthritis in the neck. X-rays may be taken to find out if there are any other problems, such as with the bones of the neck. Other tests, such as a CT scan or MRI, may also be needed.  °TREATMENT  °Treatment depends on the severity of the cervical sprain. Mild sprains can be treated with rest, keeping the neck in place (immobilization), and pain medicines. Severe cervical sprains are immediately immobilized. Further treatment is done to help with pain, muscle spasms, and other symptoms and may include: °· Medicines, such as pain relievers, numbing medicines, or muscle relaxants.   °· Physical therapy. This may involve stretching exercises, strengthening exercises, and posture training. Exercises and improved posture can help stabilize the neck, strengthen muscles, and help stop symptoms from returning.   °HOME CARE INSTRUCTIONS  °· Put ice on the injured area.   °¨ Put ice in a plastic bag.   °¨ Place a towel between your skin and the bag.   °¨ Leave the ice on for 15-20 minutes, 3-4 times a day.   °· If your injury was severe, you may have been given a cervical collar to wear. A cervical collar is a two-piece collar designed to keep your neck from moving while it heals. °¨ Do not remove the collar unless instructed by your health care provider. °¨ If you have long hair, keep it outside of the collar. °¨ Ask your health care provider before making any adjustments to your collar. Minor   adjustments may be required over time to improve comfort and reduce pressure on your chin or on the back of your head. °¨ If you are allowed to remove the collar for cleaning or bathing, follow your health care provider's instructions on how to do so safely. °¨ Keep your collar clean by wiping it with mild soap and water and drying it completely. If the collar you have been given includes removable pads, remove them every 1-2 days and hand wash them with soap and water. Allow them to air dry. They should be completely  dry before you wear them in the collar. °¨ If you are allowed to remove the collar for cleaning and bathing, wash and dry the skin of your neck. Check your skin for irritation or sores. If you see any, tell your health care provider. °¨ Do not drive while wearing the collar.   °· Only take over-the-counter or prescription medicines for pain, discomfort, or fever as directed by your health care provider.   °· Keep all follow-up appointments as directed by your health care provider.   °· Keep all physical therapy appointments as directed by your health care provider.   °· Make any needed adjustments to your workstation to promote good posture.   °· Avoid positions and activities that make your symptoms worse.   °· Warm up and stretch before being active to help prevent problems.   °SEEK MEDICAL CARE IF:  °· Your pain is not controlled with medicine.   °· You are unable to decrease your pain medicine over time as planned.   °· Your activity level is not improving as expected.   °SEEK IMMEDIATE MEDICAL CARE IF:  °· You develop any bleeding. °· You develop stomach upset. °· You have signs of an allergic reaction to your medicine.   °· Your symptoms get worse.   °· You develop new, unexplained symptoms.   °· You have numbness, tingling, weakness, or paralysis in any part of your body.   °MAKE SURE YOU:  °· Understand these instructions. °· Will watch your condition. °· Will get help right away if you are not doing well or get worse. °Document Released: 04/18/2007 Document Revised: 06/26/2013 Document Reviewed: 12/27/2012 °ExitCare® Patient Information ©2015 [employer]. This information is not intended to replace advice given to you by your health care provider. Make sure you discuss any questions you have with your health care provider. ° ° °Emergency Department Resource Guide °1) Find a Doctor and Pay Out of Pocket °Although you won't have to find out who is covered by your insurance plan, it is a good idea to ask  around and get recommendations. You will then need to call the office and see if the doctor you have chosen will accept you as a new patient and what types of options they offer for patients who are self-pay. Some doctors offer discounts or will set up payment plans for their patients who do not have insurance, but you will need to ask so you aren't surprised when you get to your appointment. ° °2) Contact Your Local Health Department °Not all health departments have doctors that can see patients for sick visits, but many do, so it is worth a call to see if yours does. If you don't know where your local health department is, you can check in your phone book. The CDC also has a tool to help you locate your state's health department, and many state websites also have listings of all of their local health departments. ° °3) Find a Walk-in Clinic °If your illness is not   likely to be very severe or complicated, you may want to try a walk in clinic. These are popping up all over the country in pharmacies, drugstores, and shopping centers. They're usually staffed by nurse practitioners or physician assistants that have been trained to treat common illnesses and complaints. They're usually fairly quick and inexpensive. However, if you have serious medical issues or chronic medical problems, these are probably not your best option. ° °No Primary Care Doctor: °- Call [facility] at  832-8000 - they can help you locate a primary care doctor that  accepts your insurance, provides certain services, etc. °- Physician Referral Service- 1-800-533-3463 ° °Chronic Pain Problems: °Organization         Address  Phone   Notes  °[facility]  (336) [phone] Patients need to be referred by their primary care doctor.  ° °Medication Assistance: °Organization         Address  Phone   Notes  °[place] [facility] [address] °[place], [place] [address] (336) 641-8030 --Must be a  resident of [place] °-- Must have NO insurance coverage whatsoever (no Medicaid/ Medicare, etc.) °-- The pt. MUST have a primary care doctor that directs their care regularly and follows them in the community °  °[facility]  (866) 331-1348   °[employer]  (888) 892-1162   ° °Agencies that provide inexpensive medical care: °Organization         Address  Phone   Notes  °[facility]  (336) 832-8035   °[facility]    (336) 832-7272   °[facility] [address] °[place], [place] [address] (336) 832-4777   °[facility] [address], °[place] (336) 271-4999   °[facility]    (336) 373-0678   °[facility]    (336) 272-1050   °[facility] ° [address], [place] Phone:  (336) 832-4444, Fax:  (336) 832-4440 Hours of Operation:  9 am - 6 pm, M-F.  Also accepts Medicaid/Medicare and self-pay.  °[facility] ° [address], [place] Phone: (336) [phone], Fax: (336) [phone]. Hours of Operation:  8:30 am - 5:30 pm, M-F.  Also accepts Medicaid and self-pay.  °[facility] [place] [address], [place] Phone: (336) 878-6027   °[facility] [address], [place],  (336)723-1848, Ext. 123 Mondays & Thursdays: 7-9 AM.  First 15 patients are seen on a first come, first serve basis. °  ° °Medicaid-accepting [place] Providers: ° °Organization         Address  Phone   Notes  °[facility] [address], [place] (336) [phone] Also accepts self-pay patients.  °[facility] [address], [place] ° (336) [phone]   °[facility] [address], [place] (336) 288-8857   °[facility] [address] [place] [address], [place] (336) 299-7000   °Veita Bland [address], [place]  ° (336) [phone] Only accepts  [place] Access Medicaid patients after they have their name applied to their card.  ° °Self-Pay (no insurance) in [place]: ° °Organization         Address  Phone   Notes  °Sickle Cell Patients, [facility] [address], [place] (336) 832-1970   °[facility] [address], [place] (336) 832-4400   °[facility] ° [address], [place] (336) 992-4800   °[facility]/Dr. Osei-Bonsu ° [address] [place] [address], [place] or [address], [place] (336) [phone] Phone number for both [place] and [place] locations is the same.  °[facility] [address], [place] (336) 299-0000   °[facility] [address] [place] [address], [place] or [address] (336) 852-7530 °(336) 878-2260   °[facility] [address], [place] (336) 350-1642, phone; (336) 294-5005, fax Sees patients 1st and 3rd Saturday of every month.  Must not qualify for public or private insurance (i.e. Medicaid, Medicare, [place] Health Choice, Veterans' Benefits) • Household income should be no more than 200% of the poverty level •The clinic cannot treat you if you are pregnant or think you are pregnant • Sexually transmitted diseases are not treated at the clinic.  ° ° °Dental Care: °Organization         Address  Phone  Notes  °[facility] [address], [place] (336) 641-6152 Accepts children up to age 21 who are enrolled in Medicaid or [place] Health Choice; pregnant women with a Medicaid card; and children who have applied for Medicaid or [place] Health Choice, but were declined, whose parents can pay a reduced fee at time of service.  °[facility] [place]  [address], [place] (336) 641-7733 Accepts children up to age 21 who are enrolled in Medicaid or [place] Health Choice; pregnant women  with a Medicaid card; and children who have applied for Medicaid or [place] Health Choice, but were declined, whose parents can pay a reduced fee at time of service.  °[facility] ° [address], [place] (336) 641-4533 Patients are seen by appointment only. Walk-ins are not accepted. [facility] will see patients 18 years of age and older. °Monday - Tuesday (8am-5pm) °Most Wednesdays (8:30-5pm) °$30 per visit, cash only  °[facility] ° [address], [place] (336) 641-4533 Patients are seen by appointment only. Walk-ins are not accepted. [facility] will see patients 18 years of age and older. °One Wednesday Evening (Monthly: Volunteer Based).  $30 per visit, cash only  °[facility]  (919) 537-3737 for adults; Children under age 4, call Graduate Pediatric Dentistry at ([phone]) [phone]. Children aged 4-14, please call ([phone]) [phone] to request a pediatric application. ° Dental services are provided in all areas of dental care including fillings, crowns and bridges, complete and partial dentures, implants, gum treatment, root canals, and extractions. Preventive care is also provided. Treatment is provided to both adults and children. °Patients are selected via a lottery and there is often a waiting list. °  °[facility] [address], °[place] ° (336) 763-8833 [email or web address] °  °[facility] [address], [place], [place] (336)723-1848, Ext. 123 Second and Fourth Thursday of each month, opens at 6:30 AM; Clinic ends at 9 AM.  Patients are seen on a first-come first-served basis, and a limited number are seen during each clinic.  ° °[facility] ° [address], [place], [place] (336) 723-7904   Eligibility Requirements °You must have lived in [place], [place], or [place] counties for at least the last three months. °    You cannot be eligible for state or federal sponsored healthcare  insurance, including Veterans Administration, Medicaid, or Medicare. °  You generally cannot be eligible for healthcare insurance through your employer.  °  How to apply: °Eligibility screenings are held every Tuesday and Wednesday afternoon from 1:00 pm until 4:00 pm. You do not need an appointment for the interview!  °[facility] [address], [place], [place] 336-631-2330   °[facility]  336-342-8273   °[facility]  336-703-3100   °[facility]  336-570-6415   ° °Behavioral Health Resources in the Community: °Intensive Outpatient Programs °Organization         Address  Phone  Notes  °[facility] [address], [place], [place] 336-878-6098   °[facility] [address], [place], [place] 336-832-9800   °[facility] [address], [place], [place] ° 336-882-2125   °[facility] [address],  °[place], [place] 1-800-853-5163 or 336-641-4981   °Substance Abuse Resources °Organization         Address  Phone  Notes  °[facility]  336-882-2125   °[facility]  336-784-9470   °[facility]  336-285-9073   °[facility]  336-845-3988   °Residential & Outpatient Substance Abuse Program  1-800-659-3381   °Psychological Services °Organization         Address  Phone  Notes  °[facility]  336- 832-9600   °[facility]  336- 378-7881   °[facility] [address], [place] 1-800-853-5163 or 336-641-4981   ° °Mobile Crisis Teams °Organization         Address  Phone  Notes  °[facility], Mobile Crisis Care Unit  1-877-626-1772   °Assertive °[facility] ° [address] [place], [place] 336-834-9664   °Sharon DeEsch [address] °[place] 336-554-5454   ° °Self-Help/Support Groups °Organization         Address  Phone             Notes  °[facility] [place] - variety of support groups  336- 373-1402 Call for more information  °Narcotics Anonymous (NA), [facility] [address], °[place]  2 meetings at this location  ° °Residential Treatment Programs °Organization         Address  Phone  Notes  °[facility] [address],    °[place]  1-866-801-8205   °[facility] ° [address], [place], [place] 704-293-8524   °[facility] [address], [place] 336-845-3988 Admissions: 8am-3pm M-F  °[facility] [address]    °[place], [place] 336-841-1104   °[facility] [address], [place], [place] 336-379-7146   °[facility] [address]  °[place], [place] 336-285-9073   °[facility] - Intensive Outpatient [address], [place], [place] 336-852-3033   °[facility] [address]  °[place], [place] 1-877-615-2722 or 336-784-9470   °[facility] [address] [place],  336-227-7417 Accepts Medicaid  °[facility] [address]  °  1-800-659-3381 Substance Abuse/Addiction Treatment  ° °[place] Behavioral Health Resources °Organization         Address  Phone  Notes  °[facility]  (888) 581-9988   °Julie Brannon, PhD [address] [place], [place]   (336) 349-5553 or (336) 951-0000   ° Behavioral   [address] °[place], [place] (336) 349-4454   °[facility] [address], [place], [place] (336) 342-8316 Insurance/Medicaid/sponsorship through [facility]  °[facility] [address]                                    [place], [place] (336) 342-8316 Therapy/tele-psych/case  °[facility] [address]  ° [place], [place] (336) 349-2233    °Dr. Arfeen  (336) 349-4544   °[facility] [place]  [employer] [place] [facility] 1) [address],  [place] °2) [address], [place] °3)  [address], [place] (336) 349-3220 °(336) 342-7768 ° °(336) 342-8140   °[place] [facility] (336) 342-1394 or (336) 342-3537 (After Hours)    ° ° ° °

## 2016-10-20 DIAGNOSIS — N938 Other specified abnormal uterine and vaginal bleeding: Secondary | ICD-10-CM | POA: Insufficient documentation

## 2016-10-20 DIAGNOSIS — D649 Anemia, unspecified: Secondary | ICD-10-CM | POA: Insufficient documentation

## 2017-07-15 DIAGNOSIS — G90511 Complex regional pain syndrome I of right upper limb: Secondary | ICD-10-CM

## 2017-07-15 HISTORY — DX: Complex regional pain syndrome i of right upper limb: G90.511

## 2017-08-26 DIAGNOSIS — M25511 Pain in right shoulder: Secondary | ICD-10-CM | POA: Insufficient documentation

## 2017-10-06 DIAGNOSIS — N836 Hematosalpinx: Secondary | ICD-10-CM | POA: Insufficient documentation

## 2019-09-05 ENCOUNTER — Encounter: Admit: 2019-09-05 | Payer: PRIVATE HEALTH INSURANCE | Primary: Internal Medicine

## 2019-09-05 ENCOUNTER — Ambulatory Visit: Admit: 2019-09-05 | Payer: MEDICAID | Attending: Internal Medicine | Primary: Internal Medicine

## 2019-09-05 DIAGNOSIS — A64 Unspecified sexually transmitted disease: Secondary | ICD-10-CM

## 2019-09-05 DIAGNOSIS — R079 Chest pain, unspecified: Secondary | ICD-10-CM

## 2019-09-05 DIAGNOSIS — D649 Anemia, unspecified: Secondary | ICD-10-CM

## 2019-09-05 DIAGNOSIS — E282 Polycystic ovarian syndrome: Secondary | ICD-10-CM

## 2019-09-05 DIAGNOSIS — F909 Attention-deficit hyperactivity disorder, unspecified type: Secondary | ICD-10-CM

## 2019-09-05 DIAGNOSIS — M1990 Unspecified osteoarthritis, unspecified site: Secondary | ICD-10-CM

## 2019-09-05 DIAGNOSIS — Z9289 Personal history of other medical treatment: Secondary | ICD-10-CM

## 2019-09-05 LAB — COMPREHENSIVE METABOLIC PANEL
BKR A/G RATIO: 1.3 (ref 1.0–2.2)
BKR ALANINE AMINOTRANSFERASE (ALT): 9 U/L — ABNORMAL LOW (ref 10–35)
BKR ALBUMIN: 4.1 g/dL (ref 3.6–4.9)
BKR ALKALINE PHOSPHATASE: 42 U/L (ref 9–122)
BKR ANION GAP: 10 (ref 7–17)
BKR ASPARTATE AMINOTRANSFERASE (AST): 17 U/L (ref 10–35)
BKR BILIRUBIN TOTAL: 0.3 mg/dL (ref <=1.2)
BKR BLOOD UREA NITROGEN: 10 mg/dL (ref 6–20)
BKR BUN / CREAT RATIO: 20.4 (ref 8.0–23.0)
BKR CALCIUM: 8.8 mg/dL (ref 8.8–10.2)
BKR CHLORIDE: 105 mmol/L (ref 98–107)
BKR CO2: 25 mmol/L (ref 20–30)
BKR CREATININE: 0.49 mg/dL (ref 0.40–1.30)
BKR EGFR (AFR AMER): >60 mL/min/{1.73_m2} (ref >60)
BKR EGFR (NON AFRICAN AMERICAN): >60 mL/min/{1.73_m2} (ref >60)
BKR GLOBULIN: 3.2 g/dL
BKR GLUCOSE: 81 mg/dL (ref 70–100)
BKR POTASSIUM: 3.5 mmol/L (ref 3.3–5.1)
BKR PROTEIN TOTAL: 7.3 g/dL (ref 6.6–8.7)
BKR SODIUM: 140 mmol/L (ref 136–144)

## 2019-09-05 LAB — CBC WITH AUTO DIFFERENTIAL
BKR WAM ABSOLUTE IMMATURE GRANULOCYTES: 0 x 1000/ÂµL (ref 0.0–0.4)
BKR WAM ABSOLUTE LYMPHOCYTE COUNT: 3 x 1000/ÂµL (ref 0.5–5.4)
BKR WAM ABSOLUTE NRBC: 0 x 1000/ÂµL
BKR WAM ANALYZER ANC: 4.1 x 1000/ÂµL (ref 2.2–7.2)
BKR WAM BASOPHIL ABSOLUTE COUNT: 0 x 1000/ÂµL (ref 0.0–0.2)
BKR WAM BASOPHILS: 0.1 % (ref 0.0–2.0)
BKR WAM EOSINOPHIL ABSOLUTE COUNT: 0.1 x 1000/ÂµL (ref 0.0–0.4)
BKR WAM EOSINOPHILS: 0.8 % (ref 0.0–4.0)
BKR WAM HEMATOCRIT: 33.8 % — ABNORMAL LOW (ref 36.0–48.0)
BKR WAM HEMOGLOBIN: 10.9 g/dL — ABNORMAL LOW (ref 12.0–15.0)
BKR WAM IMMATURE GRANULOCYTES: 0.1 % (ref 0.0–0.4)
BKR WAM LYMPHOCYTES: 39.4 % (ref 10.0–50.0)
BKR WAM MCH (PG): 28.4 pg (ref 25.0–35.0)
BKR WAM MCHC: 32.2 g/dL — ABNORMAL LOW (ref 33.0–37.0)
BKR WAM MCV: 88 fL (ref 81.0–99.0)
BKR WAM MONOCYTE ABSOLUTE COUNT: 0.5 x 1000/ÂµL (ref 0.1–1.2)
BKR WAM MONOCYTES: 6.2 % (ref 3.0–11.0)
BKR WAM MPV: 10.4 fL (ref 8.0–12.0)
BKR WAM NEUTROPHILS: 53.4 % (ref 45.0–90.0)
BKR WAM NUCLEATED RED BLOOD CELLS: 0 % (ref 0.0–0.0)
BKR WAM PLATELETS: 279 x1000/ÂµL (ref 120–450)
BKR WAM RDW-CV: 15.9 % — ABNORMAL HIGH (ref 11.5–14.5)
BKR WAM RED BLOOD CELL COUNT: 3.8 M/ÂµL (ref 3.5–5.5)
BKR WAM WHITE BLOOD CELL COUNT: 7.6 x1000/ÂµL (ref 4.8–10.8)

## 2019-09-05 LAB — SEDIMENTATION RATE (ESR): BKR SEDIMENTATION RATE, ERYTHROCYTE: 7 mm/h (ref 0–20)

## 2019-09-05 LAB — C-REACTIVE PROTEIN     (CRP): BKR C-REACTIVE PROTEIN, HIGH SENSITIVITY: 0.9 mg/L

## 2019-09-06 NOTE — Progress Notes
[facility]  Internal Medicine Outpatient Progress Note  Attending Provider: Terrace Arabia, MD   History of Presenting Illness   Anne Yoder is a 29 year old woman who presents with an approximately one-year history of chest pain.  The pain can occur in three separate locations (substernal, underneath the left breast, and subcostally on the left side) but only in one location during a given episode. The pain is a stabbing sensation that lasts approximately 10-15 minutes per occurrence and occurs approximately twice per month. The pain does not radiate. The same pain occurred from approximately 2013-2015 and then subsided until recurring approximately one year ago. Pain tends to be worse when breathing in or lying down but occasionally occurs when standing. Sitting up used to reduce pain but does not any longer.  The pain is not reproducible by palpating the affected areas. Pain is not exertional or associated with a time of day. Patient denies shortness of breath or cough except for occasionally when taking a deep breath. Patient also denies syncope and swelling. Patient denies connection of pain to stress or anxiety. Anne Yoder recently recovered from COVID-19.During the earlier occurrence of this pain, patient noted previous diagnosis of stress-induced asthma.Patient Active Problem List Diagnosis ? Anemia ? Dysfunctional uterine bleeding ? 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 Right shoulder pain ? Hematosalpinx ? Lower abdominal pain ? Right shoulder pain Medical History  PMH PSH Past Medical History: Diagnosis Date ? ADHD  ? Anemia  ? Arthritis  ? History of blood transfusion  ? PCOS (polycystic ovarian syndrome)  ? STD (sexually transmitted disease)   Past Surgical History: Procedure Laterality Date ? WISDOM TOOTH EXTRACTION Social History Family History Social History Tobacco Use ? Smoking status: Never Smoker ? Smokeless tobacco: Never Used Substance Use Topics ? Alcohol use: Not Currently   Comment: once a month   Family History Adopted: Yes Problem Relation Age of Onset ? Diabetes Maternal Grandmother  ? Hypertension Maternal Grandmother  ? High cholesterol Maternal Grandmother  ? Rheum arthritis Maternal Grandmother  ? Transient ischemic attack Maternal Grandfather  ? Prostate cancer Maternal Grandfather   Allergies Allergies Allergen Reactions ? Chapstick Hives and Lip Swelling ? Contrast Dye [Iodinated Contrast Media] Hives  CURRENT MEDICATIONS:Current Outpatient Medications: ?  norelgestromin-ethinyl estradiol (ORTHO EVRA) 150-35 mcg/24 hr weekly transdermal patch, Place 1 patch onto the skin once a week. Apply to clean, dry intact skin on the buttock, abdomen, upper outer arm or upper torso, Disp: 4 patch, Rfl: 0?  albuterol (PROVENTIL HFA;VENTOLIN HFA;PROAIR HFA) 90 mcg/actuation HFA inhaler, Inhale 2 puffs into the lungs every 6 (six) hours as needed for Wheezing. (Patient not taking: Reported on 03/02/2019), Disp: 1 Inhaler, Rfl: 0REVIEW OF SYSTEMSConstitutional:  No fever, chills; reports recent weight gain. Eyes Eye dryness reported.  HEENT Mouth dryness reported. Resp:  No cough,  wheezing, or SOB except as noted in HPI. CVS:  As per HPI. GI:  No heartburn or abdominal pain apart from left-sided subcostal pain. Skin/MSK:  No rash. Patient noted achy, tingling pain of several years' duration that occurs once every few months on right anterior leg that occurs distal to patella but proximal to ankle.  Neurologic:  Patient notes headaches on right side of head and behind right eye occurring approximately every other day. It is a throbbing pain that has occurred  for approximately two weeks.   PHYSICAL EXAMINATION     Vitals: Vitals:  09/05/19 1445 BP: 132/75 Site: Right Arm Position: Sitting Cuff Size: Medium Pulse: 78 Resp: 18 Temp: 98.2 ?F (36.8 ?C) TempSrc: Temporal Weight: 60.8 kg Height: 5' 7 (1.702 m)     Physical Exam Vitals as aboveConstitutional: Appears wellPsych: Normal mood and behaviorNeuro: PERRLA and EOMI. Patient reported slightly decreased facial sensation on right side forehead and jaw relative to left side but otherwise intact facial sensation. Able to puff cheeks. Hearing equal bilaterally. Shoulder shrug and SCM intact. Tongue midline. Head & Neck: Neck supple Eyes: No scleral icterusCVS: Normal heart sounds, no extra heart sounds or murmurs. Resp: Good air entry bilaterally. No wheezes or crackles.Abdo/GI: Soft and non tender; no masses; no organomegalyMSK: Normal extremity muscle tone and strength. Patient noted some pain when testing strength on right arm that she attributed to preexisting pathology. Skin: No rashes    INVESTIGATIONS    LABS:Complete Blood Count:Lab Results Component Value Date  WBC 6.2 01/26/2018  RBC 4.1 01/26/2018  HGB 12.4 01/26/2018  HCT 37.7 01/26/2018  MCV 91.3 01/26/2018  MCH 30.0 01/26/2018  MCHC 32.9 (L) 01/26/2018  PLT 234 01/26/2018  MPV 10.6 (H) 01/26/2018 Comprehensive Metabolic Panel:Lab Results Component Value Date  GLU 93 01/26/2018  BUN 11 01/26/2018  CREATININE 0.57 01/26/2018  NA 138 01/26/2018  K 4.1 01/26/2018  CL 104 01/26/2018  CO2 26 01/26/2018  ALBUMIN 4.5 01/26/2018  PROT 7.6 01/26/2018  BILITOT 0.5 01/26/2018  ALKPHOS 40 01/26/2018  ALT 9 01/26/2018  GLOB 3.1 01/26/2018  CALCIUM 9.5 01/26/2018 Alanine Aminotransferase (ALT) Date Value Ref Range Status 01/26/2018 9 6 - 34 U/L Final Aspartate Aminotransferase (AST) Date Value Ref Range Status 01/26/2018 14 11 - 33 U/L Final @RISRST @Hematology  panelWBC (x1000/?L) Date Value 01/26/2018 6.2 08/27/2017 5.4 08/25/2017 7.1 03/25/2017 6.7 10/22/2016 7.2  Hemoglobin (g/dL) Date Value 16/04/9603 12.4 08/27/2017 12.1 08/25/2017 13.7 03/25/2017 11.7 (L) 10/22/2016 9.6 (L)  Platelets (x1000/?L) Date Value 01/26/2018 234 08/27/2017 251 08/25/2017 278 03/25/2017 287 10/22/2016 350  MCV (fL) Date Value 01/26/2018 91.3 08/27/2017 92.0 08/25/2017 90.4 03/25/2017 92.4 10/22/2016 86.0  No results found for: RDW No results found for: RETICCTPCT Iron (ug/dL) Date Value 54/03/8118 16 (L)  No results found for: FERRITIN TIBC (ug/dL) Date Value 14/78/2956 390  No results found for: FOLATE No results found for: VITAMINB12  BMP panelSodium (mmol/L) Date Value 01/26/2018 138 08/27/2017 140 08/25/2017 140 03/25/2017 141 10/20/2016 143  Potassium (mmol/L) Date Value 01/26/2018 4.1 08/27/2017 4.1 08/25/2017 3.9 03/25/2017 4.3 10/20/2016 3.8  Chloride (mmol/L) Date Value 01/26/2018 104 08/27/2017 104 08/25/2017 103 03/25/2017 106 10/20/2016 108 (H)  CO2 (mmol/L) Date Value 01/26/2018 26 08/27/2017 22 08/25/2017 24 03/25/2017 24 10/20/2016 22  Anion Gap (no units) Date Value 01/26/2018 8 08/27/2017 14 08/25/2017 13 03/25/2017 11 10/20/2016 13  Glucose (mg/dL) Date Value 21/30/8657 93 08/27/2017 89 08/25/2017 78 03/25/2017 81 10/20/2016 86  Calcium (mg/dL) Date Value 84/69/6295 9.5 08/27/2017 9.3 08/25/2017 9.5 03/25/2017 9.8 10/20/2016 8.9  Magnesium (mg/dL) Date Value 28/41/3244 2.0  No results found for: PHOS BUN (mg/dL) Date Value 07/07/7251 11 08/27/2017 12 08/25/2017 10 03/25/2017 14 10/20/2016 8  Creatinine (mg/dL) Date Value 66/44/0347 0.57 08/27/2017 0.61 08/25/2017 0.62 03/25/2017 0.53 10/20/2016 0.48 (L)  No results found for: EGFR LFT panel Aspartate Aminotransferase (AST) (U/L) Date Value 01/26/2018 14 08/27/2017 16 10/18/2016 16  Alanine Aminotransferase (ALT) (U/L) Date Value 01/26/2018 9 08/27/2017 13 10/18/2016 23  Alkaline Phosphatase (U/L) Date Value 01/26/2018 40  08/27/2017 44 10/18/2016 45  Albumin (g/dL) Date Value 09/81/1914 4.5 08/27/2017 4.5 10/18/2016 4.0  Total Protein (g/dL) Date Value 78/29/5621 7.6 08/27/2017 7.7 10/18/2016 7.5  INR (no units) Date Value 08/27/2017 1.17 10/22/2016 1.11  Total Bilirubin (mg/dL) Date Value 30/86/5784 0.5 08/27/2017 0.3 10/18/2016 0.6  No results found for: BILIDIR  Metabolic panel No results found for: HGBA1C Thyroid Stimulating Hormone (?IU/mL) Date Value 08/25/2017 1.530 10/22/2016 1.510  No results found for: FREET4 No results found for: LDL No results found for: HDL No components found for: T3 No results found for: CHOL No results found for: TRIG    ISSUES AND PLAN   1. Chest pain, unspecified type  Pulmonary Function Test (BH Only)  EKG  CBC and differential  Comprehensive metabolic panel  Sedimentation rate (ESR)  C-reactive protein (CRP) 1. Chest pain, unspecified type- Pulmonary Function Test (BH Only); Future- EKG- CBC and differential; Future- Comprehensive metabolic panel; Future- Sedimentation rate (ESR); Future- C-reactive protein (CRP); Future Assessment: Chest pain of unclear etiology. ACS seems unlikely in light of chronic nature and subsidence of pain for several years, along with sporadic recurrences presently and non-exertional nature, along with patient's age. Cardiac anatomic abnormality seems unlikely in light of normal cardiac auscultation findings. Patient denies connection of pain with anxiety or stressors, rendering state-of-mind connection less likely. MSK etiology seems unlikely in light of inability to reproduce pain on palpation. Family history of autoimmune conditions suggests consideration of further workup of possible autoimmune and/or inflammatory etiologies. Plan: EKG to assess acute cardiac status. CBC/CMP to assess current constitutional status. ESR/CRP to assess possible inflammatory etiology. PFT to assess lung status in light of past attribution of pain to stress-induced asthma. Health Maintenance Topic Date Due ? Tetanus adult (Td q 10,TDAP once)  03/22/2012 ? Influenza vaccine  02/03/2019 ? Cervical cancer screening (Pap Smear)  06/10/2020 ? HIV screening  Completed Orders Placed This Encounter Procedures ? Sedimentation rate (ESR)   Standing Status:   Future   Standing Expiration Date:   09/04/2020   Order Specific Question:   Release to patient   Answer:   Immediate ? C-reactive protein (CRP)   Standing Status:   Future   Standing Expiration Date:   09/04/2020   Order Specific Question:   Release to patient   Answer:   Immediate ? EKG   Order Specific Question:   Reason For EKG   Answer:   Chest Pain ? Pulmonary Function Test (BH Only)   Standing Status:   Future   Standing Expiration Date:   09/04/2020   Order Specific Question:   Reason For Exam   Answer:   ?Asthma   Order Specific Question:   Is The Patient Taking Bronchodilators   Answer:   Yes   Order Specific Question:   Tests to be Performed   Answer:   Spirometry Order Specific Question:   Tests to be Performed   Answer:   Lung Volumes   Order Specific Question:   Spirometry   Answer:   Pre/Post Bronchodilator With 2.5mg  Albuterol Nebulizer ? CBC and differential   Standing Status:   Future   Standing Expiration Date:   09/04/2020   Order Specific Question:   Release to patient   Answer:   Immediate ? Comprehensive metabolic panel   Standing Status:   Future   Standing Expiration Date:   09/04/2020   Order Specific Question:   Release to patient   Answer:   Immediate  FOLLOW-UP    Cristal Deer  Keys3/09/2019 4:35 PM

## 2019-09-10 ENCOUNTER — Encounter: Admit: 2019-09-10 | Payer: PRIVATE HEALTH INSURANCE | Attending: Critical Care Medicine | Primary: Internal Medicine

## 2019-09-10 DIAGNOSIS — R079 Chest pain, unspecified: Secondary | ICD-10-CM

## 2019-09-11 ENCOUNTER — Inpatient Hospital Stay: Admit: 2019-09-11 | Discharge: 2019-09-11 | Payer: MEDICAID | Primary: Internal Medicine

## 2019-09-11 ENCOUNTER — Encounter: Admit: 2019-09-11 | Payer: PRIVATE HEALTH INSURANCE | Attending: Orthopedic Surgery | Primary: Internal Medicine

## 2019-09-11 ENCOUNTER — Ambulatory Visit: Admit: 2019-09-11 | Payer: MEDICAID | Attending: Orthopedic Surgery | Primary: Internal Medicine

## 2019-09-11 ENCOUNTER — Ambulatory Visit: Admit: 2019-09-11 | Payer: PRIVATE HEALTH INSURANCE | Primary: Internal Medicine

## 2019-09-11 DIAGNOSIS — Z9289 Personal history of other medical treatment: Secondary | ICD-10-CM

## 2019-09-11 DIAGNOSIS — A64 Unspecified sexually transmitted disease: Secondary | ICD-10-CM

## 2019-09-11 DIAGNOSIS — M1990 Unspecified osteoarthritis, unspecified site: Secondary | ICD-10-CM

## 2019-09-11 DIAGNOSIS — Z01812 Encounter for preprocedural laboratory examination: Secondary | ICD-10-CM

## 2019-09-11 DIAGNOSIS — R079 Chest pain, unspecified: Secondary | ICD-10-CM

## 2019-09-11 DIAGNOSIS — M7551 Bursitis of right shoulder: Secondary | ICD-10-CM

## 2019-09-11 DIAGNOSIS — E282 Polycystic ovarian syndrome: Secondary | ICD-10-CM

## 2019-09-11 DIAGNOSIS — M7552 Bursitis of left shoulder: Secondary | ICD-10-CM

## 2019-09-11 DIAGNOSIS — Z20822 Contact with and (suspected) exposure to covid-19: Secondary | ICD-10-CM

## 2019-09-11 DIAGNOSIS — D649 Anemia, unspecified: Secondary | ICD-10-CM

## 2019-09-11 DIAGNOSIS — F909 Attention-deficit hyperactivity disorder, unspecified type: Secondary | ICD-10-CM

## 2019-09-11 NOTE — Progress Notes
Patient follows up for her bilateral shoulders.  She has been participating well with physical therapy however contracted COVID and was unable to follow-up for last 4 sessions.  In the interim and during that time she was pushing off of a table with her right arm when she exacerbated her right shoulder pain.  She felt a snap proceeding the pain.  Since then she has had mild improvement but still with some achiness stiffness.  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  She is interested in following up with physical therapy to help establish a robust home exercise program.

## 2019-09-12 ENCOUNTER — Telehealth: Admit: 2019-09-12 | Payer: PRIVATE HEALTH INSURANCE | Attending: Obstetrics and Gynecology | Primary: Internal Medicine

## 2019-09-12 LAB — COVID-19 CLEARANCE OR FOR PLACEMENT ONLY: BKR SARS-COV-2 RNA (COVID-19) (YH): NEGATIVE

## 2019-09-12 MED ORDER — NORELGESTROMIN 150 MCG-E.ESTRADIOL 35 MCG/24 HR WEEKLY TRANSDERM PATCH
150-35 mcg/24 hr | MEDICATED_PATCH | TRANSDERMAL | 1 refills | Status: AC
Start: 2019-09-12 — End: 2019-10-15

## 2019-09-12 NOTE — Telephone Encounter
Spoke to patient. Informed her that she is overdue for her annual. Patient made aware that her medication has been filled with no refills until she is seen for her annual. Patient verbalized understanding. Patient reports that she has already scheduled an appointment for her annual.

## 2019-09-12 NOTE — Telephone Encounter
Patient is requesting a refill for norelgestromin-ethinyl estradiol (ORTHO EVRA) 150-35 mcg/24 hr weekly transdermal patch.

## 2019-09-14 ENCOUNTER — Inpatient Hospital Stay: Admit: 2019-09-14 | Discharge: 2019-09-14 | Payer: MEDICAID | Primary: Internal Medicine

## 2019-09-14 DIAGNOSIS — R079 Chest pain, unspecified: Secondary | ICD-10-CM

## 2019-09-14 MED ORDER — ALBUTEROL SULFATE 2.5 MG/3 ML (0.083 %) SOLUTION FOR NEBULIZATION
2.5 mg /3 mL (0.083 %) | Freq: Once | RESPIRATORY_TRACT | Status: CP
Start: 2019-09-14 — End: ?
  Administered 2019-09-14: 14:00:00 2.5 mL via RESPIRATORY_TRACT

## 2019-09-14 MED ORDER — ALBUTEROL SULFATE 2.5 MG/3 ML (0.083 %) SOLUTION FOR NEBULIZATION
2.5 mg /3 mL (0.083 %) | Status: CP
Start: 2019-09-14 — End: ?

## 2019-09-21 ENCOUNTER — Ambulatory Visit: Admit: 2019-09-21 | Payer: PRIVATE HEALTH INSURANCE | Attending: Physician Assistant | Primary: Internal Medicine

## 2019-10-03 ENCOUNTER — Ambulatory Visit
Admit: 2019-10-03 | Payer: PRIVATE HEALTH INSURANCE | Attending: Student in an Organized Health Care Education/Training Program | Primary: Internal Medicine

## 2019-10-03 ENCOUNTER — Encounter
Admit: 2019-10-03 | Payer: PRIVATE HEALTH INSURANCE | Attending: Student in an Organized Health Care Education/Training Program | Primary: Internal Medicine

## 2019-10-03 DIAGNOSIS — D649 Anemia, unspecified: Secondary | ICD-10-CM

## 2019-10-03 DIAGNOSIS — Z9289 Personal history of other medical treatment: Secondary | ICD-10-CM

## 2019-10-03 DIAGNOSIS — A64 Unspecified sexually transmitted disease: Secondary | ICD-10-CM

## 2019-10-03 DIAGNOSIS — F909 Attention-deficit hyperactivity disorder, unspecified type: Secondary | ICD-10-CM

## 2019-10-03 DIAGNOSIS — E282 Polycystic ovarian syndrome: Secondary | ICD-10-CM

## 2019-10-03 DIAGNOSIS — M1990 Unspecified osteoarthritis, unspecified site: Secondary | ICD-10-CM

## 2019-10-03 MED ORDER — FERROUS SULFATE 324 MG (65 MG IRON) TABLET,DELAYED RELEASE
324 mg (65 mg iron) | ORAL_TABLET | Freq: Three times a day (TID) | ORAL | 2 refills | Status: AC
Start: 2019-10-03 — End: ?

## 2019-10-03 NOTE — Progress Notes
[facility]Tele-[facility] NoteVIDEO TELEHEALTH VISIT: This clinician is part of the telehealth program and is conducting this visit in a currently approved location. For this visit the clinician and patient were present via interactive audio & video telecommunications system that permits real-time communications.Patient consent given for video visit: YesState patient is located in: [place]The clinician is appropriately licensed in the above state to provide care for this visit.Other individuals present during the telehealth encounter and their role/relation: noneTotal time spent by the provider on the day of service, which includes time spent on chart review, medical video consultation, education, coordination of care/services and counseling: 25 minsBecause this visit was completed over video, a hands-on physical exam was not performed.  Patient/parent or guardian understands and knows to call back if condition changes.The visit type for this patient required modifications due to the COVID-19 outbreak. Reason for  telephone/video visit: Follow Up ZOX:WRUEAVWU Vertell Limber, 29 y.o. PMHx PCOS, ADHD, Anemia presenting for follow up of longstanding chest pain.Last seen in clinic 09/05/2019?Chest pain started 2012-2013, subsided and then recurred last year. Mid sternal, L Breast and LUQ, no reproducible, pleuritic. EKG, CXR and [place] Abdo 2019 were unremarkable.Was sent for ESR abd CRP and PFTs.ESR 7. CRP 0.9PFT: Normal spirometry, Normal lung volumes. No methacholine challengeToday does not have chest pain. Last episode was two weeks ago. Patient says the pain is always mid-sternal, worst when she lies flat, and pleuritic only when pain is present, usually feels better when she stands or leans forward.Has never had clicking or popping under her left or right ribs.She had CXR 06/2019 which was reported within normal limits.Had [place] Chest many years ago when she was evaluated for shortness of breath.No fever, weight loss, dizziness, headache.Has been on birth control since she was 17 and switched to patch one year ago.CURRENT PROBLEM LIST:Patient Active Problem List Diagnosis ? Anemia ? Dysfunctional uterine bleeding ? 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 Right shoulder pain ? Hematosalpinx ? Lower abdominal pain ? Right shoulder pain CURRENT MEDICATIONS:Current Outpatient Medications: ?  albuterol (PROVENTIL HFA;VENTOLIN HFA;PROAIR HFA) 90 mcg/actuation HFA inhaler, Inhale 2 puffs into the lungs every 6 (six) hours as needed for Wheezing. (Patient not taking: Reported on 03/02/2019), Disp: 1 Inhaler, Rfl: 0?  norelgestromin-ethinyl estradiol (ORTHO EVRA) 150-35 mcg/24 hr weekly transdermal patch, Place 1 patch onto the skin once a week. Apply to clean, dry intact skin on the buttock, abdomen, upper outer arm or upper torso, Disp: 4 patch, Rfl: 0REVIEW OF SYSTEMS: 13 point review of systems done.  Significant only for the items mentioned in the HPI.Pertinent labs and imagings:  LABS:Complete Blood Count:Lab Results Component Value Date  WBC 7.6 09/05/2019  RBC 3.8 09/05/2019  HGB 10.9 (L) 09/05/2019  HCT 33.8 (L) 09/05/2019  MCV 88.0 09/05/2019  MCH 28.4 09/05/2019  MCHC 32.2 (L) 09/05/2019  PLT 279 09/05/2019  MPV 10.4 09/05/2019 Comprehensive Metabolic Panel:Lab Results Component Value Date  GLU 81 09/05/2019  BUN 10 09/05/2019  CREATININE 0.49 09/05/2019  NA 140 09/05/2019  K 3.5 09/05/2019  CL 105 09/05/2019  CO2 25 09/05/2019  ALBUMIN 4.1 09/05/2019  PROT 7.3 09/05/2019  BILITOT 0.3 09/05/2019  ALKPHOS 42 09/05/2019  ALT 9 (L) 09/05/2019  GLOB 3.2 09/05/2019  CALCIUM 8.8 09/05/2019 Alanine Aminotransferase (ALT) Date Value Ref Range Status 09/05/2019 9 (L) 10 - 35 U/L Final Comment:   Calcium dobesilate can cause artificially low ALT results at therapeutic concentrations Aspartate Aminotransferase (AST) Date Value Ref Range Status 09/05/2019 17 10 -  35 U/L Final  Assessment and Plan:# Mid Sternal, LUQ Pain- ?Acid reflex; r/o effusion ?anterior mediastinal pathology- [place] Chest w/ IV Contrast- Does have iodine allergies which results is mild itching of her skin, she is okay with pre-medication# AnemiaHb 10.9 MCV 88.0Hemoglobinopathy screen WNL 2018TIBC 390, Iron 16, Iron Sat 4Will prescribe iron sulphateMMA, Repeat iron indicies?Symptomatic anemia causing chest pain as patient did have resolution of symptoms that may have correlated with her normal Hb.Follow Up: 6 weeksReview labs & CTCOVID-19 Overview with patient:Covid-19 warning signs were asked and patient had not experinced any of the following s/s;Fever (100.4 or higher - 2 consecutive scans)  Sore throatHoarse voiceNew headache Persistent Cough Shortness of Breath Extreme fatigue Body Aches Acute Loss of taste and smellDiscussed Personal Protective Measures?	Avoid contact with sick people?	Wash your hands often with soap and water, use alcohol-based hand cleaner if no soap and water is available.?	To the extent possible, avoid touching high-touch surfaces in public places - door handles, handrails, handshaking with people. ?	Use a tissue or your sleeve to cover any coughs.?	Avoid touching your face, nose or eyes.?	Clean and disinfect your home to remove germs. Especially frequently touched surfaces - tables, doorknobs, light switches, toilets, faucets, phones.?	Avoid crowds, especially in poorly ventilated spaces.?	Stay home as much as possible.?	Avoid non-essential travel. ?	Maintain a distance of 6 ft. from others.?	If you do need to leave home wear a mask or other covering.Reviewed covid symptoms and precautions to minimize exposureI gave them the [facility] hotline number if they have questions: [phone]-[email or web address]-[phone]) I have reminded them of our office number should they need a more urgent appointment. Patient D/W Attending Dr. Theresa Duty signed UJ:[id number] NW:[id number] Shannon Balthazar MDIM Resident, PGY3Pager# [phone] AM3/31/2021

## 2019-10-04 DIAGNOSIS — R079 Chest pain, unspecified: Secondary | ICD-10-CM

## 2019-10-05 ENCOUNTER — Inpatient Hospital Stay: Admit: 2019-10-05 | Discharge: 2019-10-05 | Payer: MEDICAID | Primary: Internal Medicine

## 2019-10-05 NOTE — Other
[facility] [place] [address]UEAVW Number: [phone] Number: [phone] Therapy No Show Note4/2/2021Patient Name:  Anne Vanosdol LevinMedical Record Number:  [id number] Date of Birth:  08-23-1992Therapist:  Liliana Cline, PTMauricia Vertell Yoder was a no show for an appointment today.Called pt, left message. [name], PTLicense # [phone]

## 2019-10-15 ENCOUNTER — Ambulatory Visit: Admit: 2019-10-15 | Payer: MEDICAID | Attending: Physician Assistant | Primary: Internal Medicine

## 2019-10-15 ENCOUNTER — Encounter: Admit: 2019-10-15 | Payer: PRIVATE HEALTH INSURANCE | Attending: Physician Assistant | Primary: Internal Medicine

## 2019-10-15 DIAGNOSIS — D649 Anemia, unspecified: Secondary | ICD-10-CM

## 2019-10-15 DIAGNOSIS — A64 Unspecified sexually transmitted disease: Secondary | ICD-10-CM

## 2019-10-15 DIAGNOSIS — F909 Attention-deficit hyperactivity disorder, unspecified type: Secondary | ICD-10-CM

## 2019-10-15 DIAGNOSIS — Z9289 Personal history of other medical treatment: Secondary | ICD-10-CM

## 2019-10-15 DIAGNOSIS — Z113 Encounter for screening for infections with a predominantly sexual mode of transmission: Secondary | ICD-10-CM

## 2019-10-15 DIAGNOSIS — E282 Polycystic ovarian syndrome: Secondary | ICD-10-CM

## 2019-10-15 DIAGNOSIS — M1990 Unspecified osteoarthritis, unspecified site: Secondary | ICD-10-CM

## 2019-10-15 DIAGNOSIS — Z01419 Encounter for gynecological examination (general) (routine) without abnormal findings: Secondary | ICD-10-CM

## 2019-10-15 DIAGNOSIS — M778 Other enthesopathies, not elsewhere classified: Secondary | ICD-10-CM

## 2019-10-15 DIAGNOSIS — N836 Hematosalpinx: Secondary | ICD-10-CM

## 2019-10-15 LAB — ZZZTRICHOMONAS ANTIGEN     (BH): BKR TRICHOMONAS ANTIGEN: NEGATIVE

## 2019-10-15 MED ORDER — NORELGESTROMIN 150 MCG-E.ESTRADIOL 35 MCG/24 HR WEEKLY TRANSDERM PATCH
150-35 mcg/24 hr | MEDICATED_PATCH | TRANSDERMAL | 13 refills | Status: SS
Start: 2019-10-15 — End: 2019-12-28

## 2019-10-15 NOTE — Patient Instructions
Breast Self-Awareness Breast self-awareness means being familiar with how your breasts look and feel. It involves checking your breasts regularly and reporting any changes to your health care provider. Practicing breast self-awareness is important. Sometimes changes may not be harmful (are benign), but sometimes a change in your breasts can be a sign of a serious medical problem. It is important to learn how to do this procedure correctly so that you can catch problems early, when treatment is more likely to be successful. All women should practice breast self-awareness, including women who have had breast implants. What you need:  A mirror.  A well-lit room. How to do a breast self-exam A breast self-exam is one way to learn what is normal for your breasts and whether your breasts are changing. To do a breast self-exam: Look for changes  1. Remove all the clothing above your waist. 2. Stand in front of a mirror in a room with good lighting. 3. Put your hands on your hips. 4. Push your hands firmly downward. 5. Compare your breasts in the mirror. Look for differences between them (asymmetry), such as: ? Differences in shape. ? Differences in size. ? Puckers, dips, and bumps in one breast and not the other. 6. Look at each breast for changes in the skin, such as: ? Redness. ? Scaly areas. 7. Look for changes in your nipples, such as: ? Discharge. ? Bleeding. ? Dimpling. ? Redness. ? A change in position. Feel for changes Carefully feel your breasts for lumps and changes. It is best to do this while lying on your back on the floor, and again while sitting or standing in the tub or shower with soapy water on your skin. Feel each breast in the following way: 1. Place the arm on the side of the breast you are examining above your head. 2. Feel your breast with the other hand. 3. Start in the nipple area and make -inch (2 cm) overlapping circles to feel your breast. Use the pads of your  three middle fingers to do this. Apply light pressure, then medium pressure, then firm pressure. The light pressure will allow you to feel the tissue closest to the skin. The medium pressure will allow you to feel the tissue that is a little deeper. The firm pressure will allow you to feel the tissue close to the ribs. 4. Continue the overlapping circles, moving downward over the breast until you feel your ribs below your breast. 5. Move one finger-width toward the center of the body. Continue to use the -inch (2 cm) overlapping circles to feel your breast as you move slowly up toward your collarbone. 6. Continue the up-and-down exam using all three pressures until you reach your armpit.  Write down what you find Writing down what you find can help you remember what to discuss with your health care provider. Write down:  What is normal for each breast.  Any changes that you find in each breast, including: ? The kind of changes you find. ? Any pain or tenderness. ? Size and location of any lumps.  Where you are in your menstrual cycle, if you are still menstruating. General tips and recommendations  Examine your breasts every month.  If you are breastfeeding, the best time to examine your breasts is after a feeding or after using a breast pump.  If you menstruate, the best time to examine your breasts is 5-7 days after your period. Breasts are generally lumpier during menstrual periods, and it may  be more difficult to notice changes.  With time and practice, you will become more familiar with the variations in your breasts and more comfortable with the exam. Contact a health care provider if you:  See a change in the shape or size of your breasts or nipples.  See a change in the skin of your breast or nipples, such as a reddened or scaly area.  Have unusual discharge from your nipples.  Find a lump or thick area that was not there before.  Have pain in your breasts.  Have any  concerns related to your breast health. Summary  Breast self-awareness includes looking for physical changes in your breasts, as well as feeling for any changes within your breasts.  Breast self-awareness should be performed in front of a mirror in a well-lit room.  You should examine your breasts every month. If you menstruate, the best time to examine your breasts is 5-7 days after your menstrual period.  Let your health care provider know of any changes you notice in your breasts, including changes in size, changes on the skin, pain or tenderness, or unusual fluid from your nipples. This information is not intended to replace advice given to you by your health care provider. Make sure you discuss any questions you have with your health care provider. Document Released: 06/21/2005 Document Revised: 02/07/2018 Document Reviewed: 02/07/2018 Elsevier Patient Education  2020 [employer].

## 2019-10-15 NOTE — Progress Notes
HPI:The patient is a 29 y.o. G0P0000 female who presents to this practice for an annual exam. Pt reports she is happy with Ortho-Evra patch and reports regular monthly periods lasting approximately 7 days. Pt reports she has been on the patch faithfully but has recently lost her prescription in her house. Pt declines another refill at this time and states she will be looking for it in her house and hopes to find it. Until then pt is abstinent until she gets her next prescription.  Pt declines STD blood work but desires STD screening with vaginal cultures.  Pt with history significant for persistent hematosalpinx causing pain since 2019. Pt reports she wasn't having any discomfort until last month in the beginning of march. Pt reports some pain with intercourse and an episode of pain lasting approximately 25 minutes. Pt reports the pain has increased since her last episode in 2019 and desires to be re-examined for this. Pt also was given the option of removal but at the time was unable to work constraints but now pt desires to have procedure done to reduce pain. No LMP recorded.Patient History?	Problem List: has Anemia; Dysfunctional uterine bleeding; Complex regional pain syndrome type 1 affecting right upper arm; Right shoulder pain; Hematosalpinx; Lower abdominal pain; and Right shoulder pain on their problem list. ?	Contraceptive/Menstrual/Sexual HistoryContraceptive History     Using contraception currently?: Yes Contraceptive method: Ortho-Evra patches weekly Used consistently?: Yes  Have you used contraceptive methods in past?: No      Menstrual Cycle  No LMP recorded. Age of Menarche: 47 Are you post meno-pausal?: No     Period Duration (Days): 7   Period Pattern: (!) Irregular   Menstrual Flow: Moderate   Menstrual Control: Tampon, Maxi pad  Menstrual Control Change Freq (Hours): 3 tampons 2 pads  Dysmenorrhea: None     Sexual History  Sexually Active: yes Type of sexual activty: Vaginal # Sexual partners in last year: 2 STI history: [place](age 51 treated) Raped or corerced sex: No # Sexual partners in last year: 2 Pain present with intercourse: Yes(hx hydrosalpinx) Type of Dyspareunia: Present with deep penetration         OB History Gravida Para Term Preterm AB Living 0 0 0 0 0 0 SAB TAB Ectopic Molar Multiple Live Births 0 0 0 0 0 0 Obstetric Comments Cervix dilation with possible miscarriage (not confirmed) 29yo ?	Past Medical History: has a past medical history of ADHD, Anemia, Arthritis, History of blood transfusion, PCOS (polycystic ovarian syndrome), STD (sexually transmitted disease), and Tendonitis of shoulder, right.?	Past Surgical History: has a past surgical history that includes Wisdom tooth extraction.?	Allergies:is allergic to chapstick and contrast dye [iodinated contrast media]. ?	Medications: ?	Current Outpatient Medications: ?	?  albuterol sulfate, 2 puff, Inhalation, Q6H PRN (Patient not taking: Reported on 03/02/2019)?	?  ferrous sulfate, 1 tablet, Oral, TID WC?	?  norelgestromin-ethinyl estradiol, 1 patch, Transdermal, Weekly ?	Social History: reports that she has never smoked. She has never used smokeless tobacco. She reports previous alcohol use. She reports that she does not use drugs. Health Maintenance: Health Maintenance Topic Date Due ? Tetanus adult (Td q 10,TDAP once)  Never done ? Influenza vaccine  02/03/2020 ? Cervical cancer screening (Pap Smear)  06/10/2020 ? HIV screening  Completed Review of Systems:Review of Systems Genitourinary: Negative for decreased urine volume, difficulty urinating, dyspareunia, dysuria, enuresis, flank pain, frequency, genital sores, hematuria, menstrual problem, pelvic pain, urgency, vaginal bleeding, vaginal discharge and vaginal pain.  Objective: ?	BP 131/68 (Site: r a, Position: Sitting, Cuff Size: Medium)  -  Pulse 89  - Temp 97.8 ?F (36.6 ?C) (No-touch scanner)  - Resp 18  - Ht 5' 7 (1.702 m)  - Wt 60.3 kg  - BMI 20.83 kg/m?  Physical ExamGeneral:?	 Appears well, no distress	Skin:  ?	Skin color, texture, turgor normal. No rashes or lesions Lymph:?	 Inguinal Nodes NormalBreast Exam?	Normal appearance, no masses or tendernessAbdomen: ?	soft, nontender, no masses palpated, no hepatosplenomegaly	Back:?	  No curvature. No CVA tenderness.Extremities: ?	no calf tenderness, no edema bilaterally	Neuro:?	 is alert and oriented x3. Gait normal. External Genitalia: ?	General appearance; normal?	Introitus normal?	External urethral meatus appearance: normal?	Bartholin glands: non tender, erythema is  absent?	Skene glands: non tender, erythema is  absent	Uterus:?	Normal size, contour, non-tenderVagina: ?	Normal appearance?	Estrogenized ?	Discharge noted  white and thin?	Evidence of infection:  noCervix:?	Cervix has normal appearance			  Adnexa:?	 Normal?	Right tenderness	Rectal: ?	Deferred	?	Wet Prep Done: Patient/Family refusedAssessment / Plan: The patient is a  29 y.o. G0P0000 here for annual visit with pelvic pain due to hx hematosalpinx  In need of evaluation and management.   ICD-10-CM  1. Well woman exam with routine gynecological exam  Z01.419 Cytology gyn cases     ([facility])   norelgestromin-ethinyl estradiol (ORTHO EVRA) 150-35 mcg/24 hr weekly transdermal patch 2. Screen for STD (sexually transmitted disease)  Z11.3 C. trachomatis / N. gonorrhoeae, NAAT     ([facility])   Trichomonas antigen     ([facility]) 3. Hematosalpinx  N83.6 US Pelvis Complete ([facility])   [place] Non-OB Transvaginal PAP:  Completed todayPatient Education:?	Specific topics reviewed:?	STIs?	Recommended Lifestyle Modifications (Diet & Exersize)?	hemato/hydrosalpinx?	Follow-up: 1 week after [place] for tele health visit. If present pt desires removal as previously discussed and will schedule with Dr.Kashani for salpingectomyTrishia Wendell Nicoson, PA-CObstetrics & GynecologyBridgeport HospitalYale-[facility]4/12/20215:33 PM

## 2019-10-16 LAB — NEISSERIA GONORRHEA, NAAT (LAB ORDER ONLY)   (BH GH L LMW YH): BKR NEISSERIA GONORRHOEAE, DNA PROBE: NEGATIVE

## 2019-10-16 LAB — CHLAMYDIA TRACHOMATIS, NAAT (LAB ORDER ONLY) (BH GH L LMW YH): BKR CHLAMYDIA, DNA PROBE: NEGATIVE

## 2019-10-17 ENCOUNTER — Encounter: Admit: 2019-10-17 | Payer: PRIVATE HEALTH INSURANCE | Attending: Nurse Practitioner | Primary: Internal Medicine

## 2019-10-17 NOTE — Other
Neg Pap repeat 3 yrs

## 2019-10-19 ENCOUNTER — Telehealth
Admit: 2019-10-19 | Payer: PRIVATE HEALTH INSURANCE | Attending: Student in an Organized Health Care Education/Training Program | Primary: Internal Medicine

## 2019-10-19 ENCOUNTER — Encounter
Admit: 2019-10-19 | Payer: PRIVATE HEALTH INSURANCE | Attending: Student in an Organized Health Care Education/Training Program | Primary: Internal Medicine

## 2019-10-19 DIAGNOSIS — Z91041 Radiographic dye allergy status: Secondary | ICD-10-CM

## 2019-10-19 MED ORDER — PREDNISONE 50 MG TABLET
50 mg | ORAL_TABLET | Freq: Every day | ORAL | 1 refills | Status: AC
Start: 2019-10-19 — End: ?

## 2019-10-19 NOTE — Telephone Encounter
Pt has [place] Scan w/contrast scheduled per  scan pt has allergy to contrast dye and needs to be premedicated with Prednisone 50mg  PO 13 hours 7 hours and 1 hour prior to appt.  Please order meds for pt

## 2019-10-19 NOTE — Telephone Encounter
Ordered the prednisone 50 mg to be taken 13 hours, 7 hours and 1 hour prior to the scan, due to patient's history of contrast allergy

## 2019-10-19 NOTE — Telephone Encounter
routing to admin provider to order

## 2019-10-21 NOTE — Other
[facility] [address] [address]UEAVW Number: [phone] Number: [phone] Therapy Daily Note11/25/20Patient Name:  Anne Nusz LevinMedical Record Number:  [id number] Date of Birth:  [birth year]Therapist:  Renae Fickle Tippi Mccrae, PTReferring Provider:  Heath Gold, MDICD-10 Diagnosis(es):Problem List         ICD-10-CM   03/23/2019-Right shoulder,PT  * (Principal) Right shoulder pain M25.511  General InformationTherapy Episode of Care  Date of Visit:  05/30/2019   Treatment Number:  14 (no auth for 2x week)   Date the Treatment Plan was Initiated/Reviewed:  05/21/2019  Start of Care Date:  03/23/2019   Progress Report Due Date:  06/20/2019 Precautions/Limitations   Precautions/Limitations:  No known precautions/limitationsInterpreter Ecologist Utilized?  NoCognition / Learning Assessment   Primary Learner Relationship:  Patient        Barriers to learning:  No barriers        Preferred language:  EnglishMedication Review:Current Outpatient Medications Medication Sig ? albuterol sulfate Inhale 2 puffs into the lungs every 6 (six) hours as needed for Wheezing. (Patient not taking: Reported on 03/02/2019) ? ferrous sulfate Take 1 tablet (324 mg total) by mouth 3 (three) times daily with meals. ? norelgestromin-ethinyl estradiol Place 1 patch onto the skin once a week. Apply to clean, dry intact skin on the buttock, abdomen, upper outer arm or upper torso ? predniSONE Take 1 tablet (50 mg total) by mouth daily for 3 doses. To take Prednisone 50 mg 13 hours, 7 hours and 1 hour before the [place] scan SubjectiveThis exercise feels good. Re: PNFObjectiveTreatment Provided This VisitCPT Code Interventions Timed Minutes Untimed Minutes Total Minutes Therapeutic Exercise (97110) Written HEP issued and pt instructed in technique and purpose of proprioceptive neuromuscular facilitation (PNF) Seat Belt Exercises for right and left UE D1 and D2, flexion and extension diagonal patterns with VC's for slow, smooth movements.  Verbal and tactile cues used to facilitate movements and teach pt the patterns. Emphasized active scapular movements coordinated with GH joint excursion movementsStarted with passive movements to teach pt proprioceptive input.Progressed to aarom, then arom with cues for technique and smoothly graded movementsPt inst to move within available range and don't push through pain. Pt instructed in purpose and benefits of this exercise to current rehab diagnosis. Pt instructed in relaxation techniques. Pt performed 3x12 reps each pattern, left and right UE. 30  30 N/A     N/A     N/A       Total Treatment Time: 30 AssessmentGood session.Able to demonstrate adequate movement pattern and scapular activation with UE movements.PlanFrequency:  2x per weekPlan for Next VisitContinue with progressively active RUE therex and movement facilitation. Lucita Ferrara Regt, PT, DPTPhysical TherapistCertified in Neuro-Developmental [phone] Lic #[id number]

## 2019-10-22 ENCOUNTER — Ambulatory Visit
Admit: 2019-10-22 | Payer: PRIVATE HEALTH INSURANCE | Attending: Rehabilitative and Restorative Service Providers" | Primary: Internal Medicine

## 2019-10-24 ENCOUNTER — Telehealth: Admit: 2019-10-24 | Payer: PRIVATE HEALTH INSURANCE | Attending: Internal Medicine | Primary: Internal Medicine

## 2019-10-24 DIAGNOSIS — Z01812 Encounter for preprocedural laboratory examination: Secondary | ICD-10-CM

## 2019-10-24 NOTE — Telephone Encounter
Routing to Dr. Lucille Passy for assistance.Added to pending list.

## 2019-10-24 NOTE — Telephone Encounter
Please call Patient she needs to sched. A COVID Test before her U/S and [place] Scan 10-26-19. Ty.

## 2019-10-24 NOTE — Telephone Encounter
COVID test ordered.

## 2019-10-25 ENCOUNTER — Ambulatory Visit
Admit: 2019-10-25 | Payer: PRIVATE HEALTH INSURANCE | Attending: Rehabilitative and Restorative Service Providers" | Primary: Internal Medicine

## 2019-10-25 ENCOUNTER — Inpatient Hospital Stay: Admit: 2019-10-25 | Discharge: 2019-10-25 | Payer: MEDICAID | Primary: Internal Medicine

## 2019-10-25 DIAGNOSIS — Z20822 Contact with and (suspected) exposure to covid-19: Secondary | ICD-10-CM

## 2019-10-25 DIAGNOSIS — Z01812 Encounter for preprocedural laboratory examination: Secondary | ICD-10-CM

## 2019-10-25 LAB — COVID-19 CLEARANCE OR FOR PLACEMENT ONLY: BKR SARS-COV-2 RNA (COVID-19) (YH): NEGATIVE

## 2019-10-26 ENCOUNTER — Inpatient Hospital Stay: Admit: 2019-10-26 | Discharge: 2019-10-26 | Payer: MEDICAID | Primary: Internal Medicine

## 2019-10-26 DIAGNOSIS — N836 Hematosalpinx: Secondary | ICD-10-CM

## 2019-10-26 DIAGNOSIS — R079 Chest pain, unspecified: Secondary | ICD-10-CM

## 2019-10-26 MED ORDER — IOHEXOL 350 MG IODINE/ML INTRAVENOUS SOLUTION
350 mg iodine/mL | Freq: Once | INTRAVENOUS | Status: CP | PRN
Start: 2019-10-26 — End: ?
  Administered 2019-10-26: 19:00:00 350 mL via INTRAVENOUS

## 2019-10-26 NOTE — Other
Please book video visit with purple team for next available appointment

## 2019-10-29 ENCOUNTER — Encounter
Admit: 2019-10-29 | Payer: PRIVATE HEALTH INSURANCE | Attending: Student in an Organized Health Care Education/Training Program | Primary: Internal Medicine

## 2019-10-29 NOTE — Other
Message routed to clerical pool to schedule pt for pre-op clinic or with a resident on a day Dr. Penelope Coop is there

## 2019-10-30 NOTE — Other
Pt scheduled for pre-op 5/6

## 2019-11-06 ENCOUNTER — Telehealth
Admit: 2019-11-06 | Payer: PRIVATE HEALTH INSURANCE | Attending: Student in an Organized Health Care Education/Training Program | Primary: Internal Medicine

## 2019-11-06 NOTE — Telephone Encounter
Called and left a message that we need to reschedule the mychart video appointment that was scheduled for 11/14/19

## 2019-11-08 ENCOUNTER — Encounter
Admit: 2019-11-08 | Payer: PRIVATE HEALTH INSURANCE | Attending: Student in an Organized Health Care Education/Training Program | Primary: Internal Medicine

## 2019-11-08 ENCOUNTER — Encounter: Admit: 2019-11-08 | Payer: PRIVATE HEALTH INSURANCE | Primary: Internal Medicine

## 2019-11-08 ENCOUNTER — Ambulatory Visit: Admit: 2019-11-08 | Payer: MEDICAID | Attending: Obstetrics and Gynecology | Primary: Internal Medicine

## 2019-11-08 ENCOUNTER — Inpatient Hospital Stay: Admit: 2019-11-08 | Discharge: 2019-11-08 | Payer: MEDICAID | Primary: Internal Medicine

## 2019-11-08 DIAGNOSIS — R102 Pelvic and perineal pain: Secondary | ICD-10-CM

## 2019-11-08 DIAGNOSIS — F909 Attention-deficit hyperactivity disorder, unspecified type: Secondary | ICD-10-CM

## 2019-11-08 DIAGNOSIS — M25511 Pain in right shoulder: Secondary | ICD-10-CM

## 2019-11-08 DIAGNOSIS — E282 Polycystic ovarian syndrome: Secondary | ICD-10-CM

## 2019-11-08 DIAGNOSIS — M7552 Bursitis of left shoulder: Secondary | ICD-10-CM

## 2019-11-08 DIAGNOSIS — D649 Anemia, unspecified: Secondary | ICD-10-CM

## 2019-11-08 DIAGNOSIS — M778 Other enthesopathies, not elsewhere classified: Secondary | ICD-10-CM

## 2019-11-08 DIAGNOSIS — Z01818 Encounter for other preprocedural examination: Secondary | ICD-10-CM

## 2019-11-08 DIAGNOSIS — Z9289 Personal history of other medical treatment: Secondary | ICD-10-CM

## 2019-11-08 DIAGNOSIS — A64 Unspecified sexually transmitted disease: Secondary | ICD-10-CM

## 2019-11-08 DIAGNOSIS — M1990 Unspecified osteoarthritis, unspecified site: Secondary | ICD-10-CM

## 2019-11-08 MED ORDER — LIDODERM 5 % ADHESIVE PATCH
5 % | Status: AC
Start: 2019-11-08 — End: 2019-12-26

## 2019-11-08 NOTE — Patient Instructions
Diagnostic Laparoscopy Diagnostic laparoscopy is a procedure to diagnose diseases in the abdomen. It might be done for a variety of reasons, such as to look for scar tissue, cancer, or a reason for abdomen (abdominal) pain. During the procedure, a thin, flexible tube that has a light and a camera on the end (laparoscope) is inserted through an incision in the abdomen. The image from the camera is shown on a monitor to help your surgeon see inside your body. Tell a health care provider about:  Any allergies you have.  All medicines you are taking, including vitamins, herbs, eye drops, creams, and over-the-counter medicines.  Any problems you or family members have had with anesthetic medicines.  Any blood disorders you have.  Any surgeries you have had.  Any medical conditions you have. What are the risks? Generally, this is a safe procedure. However, problems may occur, including:  Infection.  Bleeding.  Allergic reactions to medicines or dyes.  Damage to abdominal structures or organs, such as the intestines, liver, stomach, or spleen. What happens before the procedure? Medicines  Ask your health care provider about: ? Changing or stopping your regular medicines. This is especially important if you are taking diabetes medicines or blood thinners. ? Taking medicines such as aspirin and ibuprofen. These medicines can thin your blood. Do not take these medicines unless your health care provider tells you to take them. ? Taking over-the-counter medicines, vitamins, herbs, and supplements.  You may be given antibiotic medicine to help prevent infection. Staying hydrated Follow instructions from your health care provider about hydration, which may include:  Up to 2 hours before the procedure - you may continue to drink clear liquids, such as water, clear fruit juice, black coffee, and plain tea. Eating and drinking restrictions Follow instructions from your health care provider  about eating and drinking, which may include:  8 hours before the procedure - stop eating heavy meals or foods such as meat, fried foods, or fatty foods.  6 hours before the procedure - stop eating light meals or foods, such as toast or cereal.  6 hours before the procedure - stop drinking milk or drinks that contain milk.  2 hours before the procedure - stop drinking clear liquids. General instructions  Ask your health care provider how your surgical site will be marked or identified.  You may be asked to shower with a germ-killing soap.  Plan to have someone take you home from the hospital or clinic.  Plan to have a responsible adult care for you for at least 24 hours after you leave the hospital or clinic. This is important. What happens during the procedure?   To lower your risk of infection: ? Your health care team will wash or sanitize their hands. ? Hair may be removed from the surgical area. ? Your skin will be washed with soap.  An IV will be inserted into one of your veins.  You will be given a medicine to make you fall asleep (general anesthetic). You may also be given a medicine to help you relax (sedative).  A breathing tube will be placed down your throat to help you breathe during the procedure.  Your abdomen will be filled with an air-like gas so it expands. This will give the surgeon more room to operate and will make your organs easier to see.  Many small incisions will be made in your abdomen.  A laparoscope and other surgical instruments will be inserted into your abdomen through the   incisions.  A tissue sample may be removed from an organ for examination (biopsy). This will depend on the reason why you are having this procedure.  The laparoscope and other instruments will be removed from your abdomen.  The gas will be released.  Your incisions will be closed with stitches (sutures) and covered with a bandage (dressing).  Your breathing tube will be  removed. The procedure may vary among health care providers and hospitals. What happens after the procedure?   Your blood pressure, heart rate, breathing rate, and blood oxygen level will be monitored until the medicines you were given have worn off.  Do not drive for 24 hours if you were given a sedative during your procedure.  It is up to you to get the results of your procedure. Ask your health care provider, or the department that is doing the procedure, when your results will be ready. Summary  Diagnostic laparoscopy is a way to look for problems in the abdomen using small incisions.  Follow instructions from your health care provider about how to prepare for the procedure.  Plan to have a responsible adult care for you for at least 24 hours after you leave the hospital or clinic. This is important. This information is not intended to replace advice given to you by your health care provider. Make sure you discuss any questions you have with your health care provider. Document Released: 09/27/2000 Document Revised: 06/03/2017 Document Reviewed: 12/15/2016 Elsevier Patient Education  2020 Elsevier Inc.  

## 2019-11-09 DIAGNOSIS — N836 Hematosalpinx: Secondary | ICD-10-CM

## 2019-11-09 NOTE — Progress Notes
Patient Data:  Patient Name: Anne Yoder Age: 29 y.o. DOB: 10-30-1990 MRN: [id number] GYN PRE-OPERATIVE VISIT Chief ComplaintPelvic Pain, Tubal abnormalityHPI:Patient reports several years of pelvic pain, that is on and off.  Used to be every 3 months or so, however it has, on several times in the last 3 months every week or so and is worsening in severity.  It is achy and dull in nature and she cannot identify any aggravating or relieving factors, nor any relation to her cycle.  She has tried Depo, Mirena, patch, Nexplanon without any improvement in the pain.  She has normal bowel movements.  She does have a suspected new diagnosis of heartburn and is planned to see a GI.  Patient states that when her pain initially began years ago, she had an abnormality in her tube.  She was advised that she should return for surgical management if desired, pain persists or worsens, or she desires to conceive.  Patient reports she presented because her pain has become more frequent and more severe.  She desires surgical management by Dr. Penelope Coop, whom she's met in the past, and understands that this is not necessarily going to resolve her pain.  She denies any abnormal discharge, shortness of breath, cough, chest pain, other complaints today aside from anxiety around having her 1st surgical procedure. She most likely the Mirena in terms of her birth control options for now.  She does not desire children at this time but states she definitely wants children in the future.  She is currently on the patch but is not satisfied with that for contraception as she was with Mirena. She expressed anxiety about abnormalities in her other tube, and concerned about conceiving in the future should that be the case.Sleep Apnea Screeningdenies.GYN Hx:  Chlamydia in her 38s; recent negative culturesPap 10/15/2019 NILM OB Hx:OB History Gravida Para Term Preterm AB Living 0 0 0 0 0 0 SAB TAB Ectopic Molar Multiple Live Births 0 0 0 0 0 0 Obstetric Comments Cervix dilation with possible miscarriage (not confirmed) 29yo WUJ:WJXB Medical History: Diagnosis Date ? ADHD  ? Anemia  ? Arthritis  ? History of blood transfusion  ? PCOS (polycystic ovarian syndrome)  ? STD (sexually transmitted disease)  ? Tendonitis of shoulder, right   Rotator Cuff  JYN:WGNF Surgical History: Procedure Laterality Date ? WISDOM TOOTH EXTRACTION   Current Meds:Current Outpatient Medications Medication Sig ? ferrous sulfate Take 1 tablet (324 mg total) by mouth 3 (three) times daily with meals. ? Lidoderm  ? norelgestromin-ethinyl estradiol Place 1 patch onto the skin once a week. Apply to clean, dry intact skin on the buttock, abdomen, upper outer arm or upper torso ? albuterol sulfate Inhale 2 puffs into the lungs every 6 (six) hours as needed for Wheezing. (Patient not taking: Reported on 03/02/2019) Allergies:Allergies Allergen Reactions ? Chapstick Hives and Lip Swelling ? Contrast Dye [Iodinated Contrast Media] Itching Imaging Review:FINDINGS: ?     TRANSABDOMINAL/TRANSVAGINAL EXAM:      ?Uterus:  Measures 6.8 x 4.9 x 3.1 cm. The uterus demonstrates normal in size and echotexture. The endometrium measures up to 7 mm. ?Right Ovary:  Measures 3.0 x 1.5 x 1.9 cm. The ovary is unremarkable. There is normal vascularity.Interval decrease in size of  right adnexa tubular structure with mild internal debris again compatible with hematosalpinx or complex hydrosalpinx.?Left Ovary:  Measures 4.2 x 1.5 x 2.5 cm. The ovary is unremarkable. There is normal vascularity.?Peritoneum: There is no free fluid seen within the  pelvis.?   IMPRESSION: ??	Interval decrease in size of  right adnexa tubular structure with mild internal debris, again compatible with hematosalpinx or complex hydrosalpinx.Vital Signs BP 126/72  - Pulse 87  - Temp 98.1 ?F (36.7 ?C) (No-touch scanner)  - Resp 18  - Ht 5' 7 (1.702 m)  - Wt 62.1 kg  - LMP 10/01/2019 (Exact Date)  - BMI 21.46 kg/m?  General:?	 Appears well, no distress	HEENT/Thyroid:?	Nose is clear, Conjunctivae is clear, Neck is supple and no adenopathy?	normal to inspection and palpationGeneral:?	 Appears well, no distress	Lungs: ?	Respirations unlabouredHeart: ?	Regular rate and rhythmAbdomen: ?	soft, nontender, no masses palpated, no hepatosplenomegaly	GU:?	Exam deferred.Extremities: ?	no calf tenderness, no edema bilaterally	Assessment / Plan:Pt with chronic pelvic pain suspected secondary to Adnexal Mass- 625.8 (hematocrit salpinx versus complex hydrosalpinx) presenting for preoperative evaluation.  She also desires Mirena IUD.  Pt counseled extensively regarding surgical options including Diagnostic laparoscopy possible salpingectomy, possible cystectomy, possible oophorectomy, possible lysis of adhesions, possible laparotomy, chromopertubation, Mirena insertion.Discussed the risks/benefits and alternatives to the proposed surgery including bleeding, infection, damage to tissues, irregular bleeding, expulsion of IUD, need for IVF should bilateral tubes be removed.  She requested that the alternate to be evaluated, and chromopertubation was recommended in that case.  Should that to be abnormal, patient states she would like to think about whether she would like her to be removed, or even if either tube should be removed.  She will discuss with her surgeon prior to surgery.  She is also aware that this procedure may not resolve her pain.  The following topics were reviewed with the patient.?	Pt does agree to blood transfusion if necessary. ?	The patient understands she will need to be NPO at midnight day prior to procedure. ?	The patient understands she will stop all herbal supplements one week prior to OR. ?	Pt will refrain from ASA/NSAIDS one week prior to OR. ?	She understands that she will need a ride home from the PACU and will need someone with her for 24 hours after surgery.Booking InformationPlanned Surgery:  Diagnostic laparoscopy possible salpingectomy, possible cystectomy, possible oophorectomy, possible lysis of adhesions, possible laparotomy, chromopertubation, Mirena insertion.Attending: Vernie Shanks Booked: TBDPreop medical clearance needed:NoPAT needed: NoPreop labs:?	Type/Screen?	CBC ?	Needs preop COVID testUtox: NoContact info: [phone] Consent signed: YesState hysterectomy :N/ATubal consent signed:N/APost Op Appt: 2 weeks post opD/w Dr Hermenia Bers. Will contact Dr Penelope Coop to have patient scheduled per pt request. Lum Babe, MD, PGY4Obstetrics & GynecologyBridgeport HospitalYale [facility]5/12/2019

## 2019-11-13 ENCOUNTER — Encounter: Admit: 2019-11-13 | Payer: PRIVATE HEALTH INSURANCE | Attending: Orthopedic Surgery | Primary: Internal Medicine

## 2019-11-13 ENCOUNTER — Ambulatory Visit: Admit: 2019-11-13 | Payer: MEDICAID | Attending: Orthopedic Surgery | Primary: Internal Medicine

## 2019-11-13 ENCOUNTER — Inpatient Hospital Stay: Admit: 2019-11-13 | Discharge: 2019-11-13 | Payer: MEDICAID | Primary: Internal Medicine

## 2019-11-13 DIAGNOSIS — M1990 Unspecified osteoarthritis, unspecified site: Secondary | ICD-10-CM

## 2019-11-13 DIAGNOSIS — D649 Anemia, unspecified: Secondary | ICD-10-CM

## 2019-11-13 DIAGNOSIS — E282 Polycystic ovarian syndrome: Secondary | ICD-10-CM

## 2019-11-13 DIAGNOSIS — M7552 Bursitis of left shoulder: Secondary | ICD-10-CM

## 2019-11-13 DIAGNOSIS — M7551 Bursitis of right shoulder: Secondary | ICD-10-CM

## 2019-11-13 DIAGNOSIS — M75101 Unspecified rotator cuff tear or rupture of right shoulder, not specified as traumatic: Secondary | ICD-10-CM

## 2019-11-13 DIAGNOSIS — Z9289 Personal history of other medical treatment: Secondary | ICD-10-CM

## 2019-11-13 DIAGNOSIS — M778 Other enthesopathies, not elsewhere classified: Secondary | ICD-10-CM

## 2019-11-13 DIAGNOSIS — A64 Unspecified sexually transmitted disease: Secondary | ICD-10-CM

## 2019-11-13 DIAGNOSIS — S46211A Strain of muscle, fascia and tendon of other parts of biceps, right arm, initial encounter: Secondary | ICD-10-CM

## 2019-11-13 DIAGNOSIS — F909 Attention-deficit hyperactivity disorder, unspecified type: Secondary | ICD-10-CM

## 2019-11-13 NOTE — Progress Notes
DIAGNOSES:1.	Bilateral shoulder rotator cuff tendinitis.2.	Right shoulder biceps tendinitis.SUBJECTIVE:29 year old female returns for followup.  She was last seen on 09/11/2019 and denies interval injury.  She is in physical therapy and compliant with her home exercise program.  Her left shoulder pain has resolved.  She still has some residual anterior right shoulder discomfort based on activity level.  Denies fever, chills or night sweats.  She is right-hand dominant.OBJECTIVE:GENERAL:  Pleasant, in no acute distress, A and O x3.  DERMATOLOGICAL:  Bilateral shoulder skin intact.  No erythema, ecchymosis, or sign of infection.  No visible or palpable defects.  MUSCULOSKELETAL: Mildly tender anterior right shoulder in the region of the biceps insertion.  Nontender throughout the rest of the shoulder.  Nontender throughout the left shoulder.  Bilateral shoulder full glenohumeral range of motion.  Strength is 5/5 throughout.  Distally neurovascularly intact.  Negative empty can, O'Brien, and Hawkins bilaterally.  Mildly positive speed's on the right, negative on the left.  Negative Popeye sign. Distally neurovascularly intact.ASSESSMENT AND PLAN:A 29 year old female with the above-noted diagnoses:1.	Reviewed diagnosis, differential, and treatment options with patient. All questions answered.2.	Activity modification, cryotherapy.3.	She will continue physical therapy as well as her home exercise program.4.	Follow up in 4-6 weeks for reevaluation.

## 2019-11-13 NOTE — Other
[facility] CENTER REHABILITATION [facility] [place], [address] [place] [address]YNWGN Number: [phone] Number: [phone] Therapy Daily Note5/11/2021Patient Name:  Anne Morrill LevinMedical Record Number:  [id number] Date of Birth:  [birth year]Therapist:  Leodis Sias, PTAReferring Provider:  Cherlyn Labella, MDICD-10 Diagnosis(es):Problem List         ICD-10-CM   03/23/2019-Right shoulder,PT  * (Principal) Right shoulder pain M25.511  General InformationTherapy Episode of Care  Date of Visit:  11/13/2019   Treatment Number:  2 (no auth for 2x week)   Date the Treatment Plan was Initiated/Reviewed:  11/08/2019  Start of Care Date:  11/08/2019   Progress Report Due Date:  01/08/2020 Precautions/Limitations   Precautions/Limitations:  Standard precautionsInterpreter Services   Interpreter Services Utilized?  NoCognition / Learning Assessment   Primary Learner Relationship:  Patient        Barriers to learning:  No barriers        Preferred language:  English        Preferred learning style:  Listening and Pictures/VideoSubjectivePt reports doing HEP but that she was bit on her R hand by a dog a couple of days ago and is having difficulty flexing pointer finger so she has not been able to do much with the bands.It is getting better but still hurts and has some swelling. She will go to the ER if this persists another day or two per her report.ObjectiveNot assessed this sessionTreatment Provided This VisitCPT Code Interventions Timed Minutes Untimed Minutes Total Minutes Therapeutic Exercise (97110) -UBE X 2 min each wayHEP review: (simulated t-band use but performed AROM only due to dog bite)- rows - B shoulder ext - R shoulder IR-B shoulder ER(pt recalls HEP)-supine rhythmic stabilization with R shoulder at 90 degrees of flex. Then for IR/ER, biceps/triceps with shoulder in neutral and elbow flexed to 90.-L side lying R scap depression with manual resistanceAssessed B shoulder AROM into flex with scap mobility 25  25 Manual Therapy (97140) n/a    N/A     N/A       Total Treatment Time: 25 AssessmentPt demonstrated HEP compliance however held t-band this session due dog bite to R hand. Some minor swelling noted R hand and area of location of dog bite but no open areas seen this session as it has been healing .Encouraged to seek medical attention if this gets worse. Difficulty with index finger flex noted this sessionPlanFrequency:  2x per weekPlan for Next VisitContinue to work on strength and scap stability. Assess hand mobilityLisa Chigozie Basaldua, PTAPhysical Therapist AssistantCT License # [id number]

## 2019-11-13 NOTE — Other
[facility] CENTER REHABILITATION [facility] [place], [address] [place] [address]UEAVW Number: [phone] Number: [phone] Therapy Evaluation5/6/2021Patient Name:  Anne Schwake LevinMedical Record Number:  [id number] Date of Birth:  1992-08-19Therapist:  Nancy Nordmann, PTReferring Provider:  Cherlyn Labella, MDICD-10 Diagnosis(es):Problem List         ICD-10-CM   03/23/2019-Right shoulder,PT  * (Principal) Right shoulder pain M25.511  General InformationTherapy Episode of Care  Date of Visit:  11/08/2019   Treatment Number:  1    Date the Treatment Plan was Initiated/Reviewed:  11/08/2019  Progress Report Due Date:  01/08/2020 Precautions/Limitations   Precautions/Limitations:  No known precautions/limitationsCognition / Learning Assessment   Primary Learner Relationship:  Patient        Barriers to learning:  No barriers        Preferred language:  English        Preferred learning style:  Listening and Pictures/VideoI reviewed the Patient Care Agreement and Attendance Form with the Patient/Family.  The Patient/Family verbalized understanding.Medication Review:Current Outpatient Medications Medication Sig ? albuterol sulfate Inhale 2 puffs into the lungs every 6 (six) hours as needed for Wheezing. (Patient not taking: Reported on 03/02/2019) ? ferrous sulfate Take 1 tablet (324 mg total) by mouth 3 (three) times daily with meals. ? Lidoderm  ? norelgestromin-ethinyl estradiol Place 1 patch onto the skin once a week. Apply to clean, dry intact skin on the buttock, abdomen, upper outer arm or upper torso SubjectivePertinent History of Current Problem:  Patient reports that her right shoulder pain started in 2015.  She would sleep on her right side and when she woke up the shoulder would be hurting.  The pain subsided and then returned in 2018.  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  The pain is intermittent but increases with lifting, carrying, and anything overhead. . The pain is shooting from the anterior shoulder and superior shoulder down the arm but doesn't pass the elbow.   She had therapy in February 2019 with relief and therefore returns again today to decrease shoulder pain again. Patient was seen in this office having right shoulder pain last year.  She was getting better with therapy but then had to stop because she got covid in December. She stopped before she was comfortable with her HEP.  She was referred back to PT to learn her HEP.  Also, about 3 weeks ago she heard a loud pop in the shoulder and had a lot of pain with that.  It has since significantly decreased in pain and she now reports having her usual achy pain. Other Providers: Dr. Gean Quint, MDPast Medical HistoryPast Medical History: Diagnosis Date ? ADHD  ? Anemia  ? Arthritis  ? History of blood transfusion  ? PCOS (polycystic ovarian syndrome)  ? STD (sexually transmitted disease)  ? Tendonitis of shoulder, right   Rotator Cuff  Past Surgical HistoryPast Surgical History: Procedure Laterality Date ? WISDOM TOOTH EXTRACTION   AllergiesChapstick and Contrast dye [iodinated contrast media]Pain Rating:  5 / 10   Comments:Achey right anterior and lateral shoulder Prior Level of Functioning:  Been having right shoulder pain for 5 yearsGoal of Physical Therapy:  Decrease right shoulder pain with lifting/carrying.  No pain with sleeping or upon waking upObjectivePostureForward headRounded shouldersTight pecsNeuro ScreenEqual sensation at both UEsROMRight shoulder flexion 180Right shoulder extension 60Right shoulder abduction 180Right shoulder IR 30Right shoulder ER 30Left shoulder flexion 180Left shoulder extension 60Left shoulder  abduction 180Left shoulder IR 30Left shoulder ER shoulder flexion  4/5Right shoulder extension 4/5Right shoulder abduction 4/5Right shoulder IR 4/5Right shoulder ER 4/5Left shoulder flexion  5/5Left shoulder extension 5/5Left shoulder abduction 5/5Left shoulder IR 5/5Left shoulder ER 5/5Special TestsPatient Specific Functional Scale (PSFS)   Activity #1 Score:  5      Details:   Overhead activities    Activity #2 Score:  6      Details:   Lifting 10 pounds   Activity #3 Score:  6      Details:   Reaching behind back   TOTAL SCORE:  5.67   (PSFS Scoring:  0=Extreme difficulty completing task     10=Performs task easily)QuickDASH - Upper Extremity Function      Open a tight or new jar:  1      Do heavy household chores:  3      Carrying a shopping bag or briefcase:  3      Wash your back:  3      Use a knife to cut food:  1      Activities with force/impact through arm, shoulder or hand:  4      To what extent has problem interfered with normal social activities:  3      Are you limited in work or other daily activities:  3      Severity of arm, shoulder or hand pain:  3      Tingling in arm, shoulder or hand:  3      How much trouble sleeping due to pain in arm, shoulder or hand:  3   Score:  [id number]   (The QDASH score ranges from 0-100, with higher scores reflecting increased disability)   Comments:  Patient is right handedPain with right empty can test PalpationSlight tenderness over supraspinatusFunctional MovementsIncreased pain with lifting/pushing/carrying/liftingIncreased right shoulder pain with overhead activities.  Treatment Provided This VisitCPT Code Interventions Timed Minutes Untimed Minutes Total Minutes Physical Therapy Evaluation - Low Complexity (97161) Right shoulder painUBE 5 min Gave out and reviewed new HEP: Shoulder IR, shoulder ER all with green TB; gave out green TB 20x every other day, scap retractions with green theraband, shoulder extensions with green theraband 20x  45   N/A     N/A     N/A       Total Treatment Time: 45 Problem ListDecreased strength on the right UEAssessmentPatient is referred back to physical therapy having had right shoulder pain last year.  She was doing better with therapy but then had to stop with 4 sessions left because of Covid-19.  She did not remember her exercises.  She returned back to the doctor who encouraged her to finish out her PT so that she could learn her HEP.  She also had heard a loud pop noise and had a lot of pain 3 weeks ago.  Since then the pain has returned to her normal achy pain.  She has pain and difficulty with overhead activities.  She has a weak right RTC compared to the left side.  It is recommended that she do PT 2x a week for 8 more weeks to address her HEP. Patient / Family / Caregiver EducationDiscussed role of therapyReviewed the assessmentDiscussed plan of care and rationaleRehab PotentialFairPlanPhysical Therapy Evaluation - Low Complexity (97161)Frequency:  2x per weekDuration:  8 weeksPlan for Next Visit:Isometrics, RTC strengthening, scapulae release, postural education and strengtheningRecommendations2x a week for 8 weeks to teach HEP. GoalsShort Term Goals:By 6/6/20211. Patient  will be independent in a HEP by2. Patient will have increased shoulder flexion by 10 degrees to have increased ability to perform overhead activities and wash hair by 3. Patient will have increased shoulder strength by 1/2 grade to have increased ability to lift/carry to be able to lift groceries by 4. Improve scapulae stability by 50% to improve upper body posture to improve PSFS by 3 points byLong Term Goals: By 7/6/20211. Patient will have increased shoulder flexion by 20 degrees to have increased ability to perform overhead activities and put plates away in a cabinet by2. Patient will have increased shoulder strength by 1 grade to have increased ability to lift/carry to be able to cook and clean by3. Improve scapulae stability by 75% to improve upper body posture to improve PSFS by 5 points by 4. Patient will be able to sleep 7-8 hours without interruption from pain by Cephas Darby, DPT, CLT LIC #  [id number] [email or web address]

## 2019-11-14 ENCOUNTER — Emergency Department: Admit: 2019-11-14 | Payer: PRIVATE HEALTH INSURANCE | Primary: Internal Medicine

## 2019-11-14 ENCOUNTER — Ambulatory Visit
Admit: 2019-11-14 | Payer: PRIVATE HEALTH INSURANCE | Attending: Student in an Organized Health Care Education/Training Program | Primary: Internal Medicine

## 2019-11-14 ENCOUNTER — Inpatient Hospital Stay: Admit: 2019-11-14 | Discharge: 2019-11-14 | Payer: MEDICAID

## 2019-11-14 DIAGNOSIS — Z91041 Radiographic dye allergy status: Secondary | ICD-10-CM

## 2019-11-14 DIAGNOSIS — D649 Anemia, unspecified: Secondary | ICD-10-CM

## 2019-11-14 DIAGNOSIS — Z91048 Other nonmedicinal substance allergy status: Secondary | ICD-10-CM

## 2019-11-14 DIAGNOSIS — R200 Anesthesia of skin: Secondary | ICD-10-CM

## 2019-11-14 DIAGNOSIS — Z79899 Other long term (current) drug therapy: Secondary | ICD-10-CM

## 2019-11-14 DIAGNOSIS — M75102 Unspecified rotator cuff tear or rupture of left shoulder, not specified as traumatic: Secondary | ICD-10-CM

## 2019-11-14 DIAGNOSIS — S61250A Open bite of right index finger without damage to nail, initial encounter: Secondary | ICD-10-CM

## 2019-11-14 NOTE — ED Provider Notes
HistoryChief Complaint Patient presents with ? Animal Bite   dog bite to right hand on Sunday. Seen at [facility] for the same. Connerned for infection. No fevers.   29 year old right hand dominant female presents with right hand pain s/p dog bite x 4 days ago. Patient states she was bitten by her aunt's dog who is up to date with vaccines. Reports swelling initially which has improved. She went to [facility] urgent care on 5/10 and was prescribed Augmentin which she has been compliant with. Endorses numbness to distal right index finger.  Patient presents today because of continued pain, mother encouraged she come for evaluation. Tetanus is up to date. Patient denies fever, chills, erythema, purulent drainage, loss of motor function.The history is provided by the patient. Animal BiteAssociated symptoms: numbness (right index finger)  Associated symptoms: no fever   Past Medical History: Diagnosis Date ? ADHD  ? Anemia  ? Arthritis  ? History of blood transfusion  ? PCOS (polycystic ovarian syndrome)  ? STD (sexually transmitted disease)  ? Tendonitis of shoulder, right   Rotator Cuff  Past Surgical History: Procedure Laterality Date ? WISDOM TOOTH EXTRACTION   Family History Adopted: Yes Problem Relation Age of Onset ? Diabetes Maternal Grandmother  ? Hypertension Maternal Grandmother  ? High cholesterol Maternal Grandmother  ? Rheum arthritis Maternal Grandmother  ? Transient ischemic attack Maternal Grandfather  ? Prostate cancer Maternal Grandfather  ? Stroke Maternal Grandfather  Social History Socioeconomic History ? Marital status: Single   Spouse name: Not on file ? Number of children: Not on file ? Years of education: Not on file ? Highest education level: Not on file Tobacco Use ? Smoking status: Never Smoker ? Smokeless tobacco: Never Used Substance and Sexual Activity ? Alcohol use: Not Currently Comment: once a month  ? Drug use: No ? Sexual activity: Yes   Partners: Male   Birth control/protection: Patch   Comment: oral ED Other Social History ? E-cigarette status Never User  ? E-Cigarette Use Never User  E-cigarette/Vaping Substances ? Nicotine No  ? THC No  ? CBD No  ? Flavoring No  E-cigarette/Vaping Devices ? Disposable No  ? Pre-filled or Refillable Cartridge No  ? Refillable Tank No  ? Pre-filled Pod No  Review of Systems Constitutional: Negative for chills and fever. Gastrointestinal: Negative for nausea and vomiting. Musculoskeletal:      Right hand pain Skin: Positive for wound. Neurological: Positive for numbness (right index finger).  Physical ExamED Triage Vitals [11/14/19 0943]BP: 128/80Pulse: 70Pulse from  O2 sat: n/aResp: 17Temp: 98 ?F (36.7 ?C)Temp src: TemporalSpO2: 100 % BP 119/78  - Pulse 88  - Temp 98.8 ?F (37.1 ?C) (Oral)  - Resp 18  - Wt 60 kg (132 lb 4.4 oz)  - SpO2 99%  - BMI 20.72 kg/m? Physical ExamVitals signs and nursing note reviewed. Constitutional:     General: She is not in acute distress.   Appearance: She is well-developed. She is not ill-appearing or toxic-appearing. HENT:    Head: Normocephalic and atraumatic. Eyes:    Conjunctiva/sclera: Conjunctivae normal. Neck:    Musculoskeletal: Neck supple. Cardiovascular:    Pulses:         Radial pulses are 2+ on the right side. Pulmonary:    Effort: Pulmonary effort is normal. No respiratory distress. Musculoskeletal:    Comments: FROM right digits Skin:   General: Skin is warm and dry.    Findings: Wound present.    Comments: Bite marks to  right dorsal, palmar hand, and  right index finger - healing well, no erythema, fluctuance, TTP or purulent drainage. Neurological:    Mental Status: She is alert. Psychiatric:       Behavior: Behavior normal.  ProceduresProcedures ED COURSEInterpreted by ED Provider: x-rayPatient Reevaluation: 29 year old female presents with right hand pain s/p dog bite x 4 days ago. No signs of infection on exam, x-ray is negative for fracture or foreign body. Neurovascularly intact. Tetanus is up-to-date. Discharged with instructions to rest, ice, elevate the hand, OTC analgesia and continue taking Augmentin as prescribed. Patient given information for Hand should symptoms persist. Return precautions given, patient verbalizes understanding and is comfortable with the plan- all questions answered.Clinical Impressions as of Nov 13 1457 Dog bite of right hand, initial encounter  ED DispositionDischargeI performed a history and physical examination of Azariah Sharlena Kristensen and discussed her management with Maxine Glenn, PA.  I agree with the history, physical, assessment, and plan of care, with the following exceptions: NoneI was present for the following procedures: NoneTime Spent in Critical Care of the patient: Hollie Beach, MD Maxine Glenn, PA05/12/21 1119 Austin Miles, MD05/12/21 1446 Austin Miles, MD05/12/21 1459

## 2019-11-15 ENCOUNTER — Inpatient Hospital Stay: Admit: 2019-11-15 | Discharge: 2019-11-15 | Payer: MEDICAID | Primary: Internal Medicine

## 2019-11-15 ENCOUNTER — Telehealth: Admit: 2019-11-15 | Payer: PRIVATE HEALTH INSURANCE | Attending: Obstetrics and Gynecology | Primary: Internal Medicine

## 2019-11-15 ENCOUNTER — Encounter: Admit: 2019-11-15 | Payer: PRIVATE HEALTH INSURANCE | Attending: Obstetrics and Gynecology | Primary: Internal Medicine

## 2019-11-15 DIAGNOSIS — Z01818 Encounter for other preprocedural examination: Secondary | ICD-10-CM

## 2019-11-15 DIAGNOSIS — M7551 Bursitis of right shoulder: Secondary | ICD-10-CM

## 2019-11-15 NOTE — Telephone Encounter
Called and spoke with patient to give her the date & time of procedure, Friday 12/28/19 arriving at 6:30 to the hospital.  Advised patient she needs to do blood work within 30 days of the procedure and a covid test within 2-3 day of procedure.  Patient needs fmla paperwork, she will forward to me when she receives.

## 2019-11-29 ENCOUNTER — Inpatient Hospital Stay: Admit: 2019-11-29 | Discharge: 2019-11-29 | Payer: MEDICAID | Primary: Internal Medicine

## 2019-11-29 DIAGNOSIS — M7552 Bursitis of left shoulder: Secondary | ICD-10-CM

## 2019-11-29 DIAGNOSIS — M7551 Bursitis of right shoulder: Secondary | ICD-10-CM

## 2019-11-29 NOTE — Other
[facility] CENTER REHABILITATION [facility] [place], [address] [place] [address]UEAVW Number: [phone] Number: [phone] Therapy No Show Note5/27/2021Patient Name:  Anne Sassaman LevinMedical Record Number:  [id number] Date of Birth:  04-01-1992Therapist:  Leodis Sias, PTAMauricia Chanda Busing Ryer was a no show for an appointment today.Called but not able to reach.Leodis Sias, PTAPhysical Therapist AssistantCT License # [id number]

## 2019-12-03 DIAGNOSIS — M25511 Pain in right shoulder: Secondary | ICD-10-CM

## 2019-12-03 DIAGNOSIS — M7551 Bursitis of right shoulder: Secondary | ICD-10-CM

## 2019-12-03 DIAGNOSIS — M7552 Bursitis of left shoulder: Secondary | ICD-10-CM

## 2019-12-04 ENCOUNTER — Inpatient Hospital Stay: Admit: 2019-12-04 | Discharge: 2019-12-04 | Payer: MEDICAID | Primary: Internal Medicine

## 2019-12-04 DIAGNOSIS — M25511 Pain in right shoulder: Secondary | ICD-10-CM

## 2019-12-04 NOTE — Other
[facility] CENTER REHABILITATION [facility] [place], [address] [place] [address]WJXBJ Number: [phone] Number: [phone] Therapy Daily Note6/1/2021Patient Name:  Anne Guerrieri LevinMedical Record Number:  [id number] Date of Birth:  [birth year]Therapist:  Loistine Chance Halee Glynn, PTReferring Provider:  Cherlyn Labella, MDICD-10 Diagnosis(es):Problem List         ICD-10-CM   03/23/2019-Right shoulder,PT  Right shoulder pain M25.511  General InformationTherapy Episode of Care  Date of Visit:  12/04/2019   Treatment Number:  4 (no auth for 2x week)   Date the Treatment Plan was Initiated/Reviewed:  11/08/2019  Start of Care Date:  11/08/2019   Progress Report Due Date:  01/08/2020 Precautions/Limitations   Precautions/Limitations:  Standard precautionsInterpreter Services   Interpreter Services Utilized?  NoCognition / Learning Assessment   Primary Learner Relationship:  Patient        Barriers to learning:  No barriers        Preferred language:  English        Preferred learning style:  Listening and Pictures/VideoSubjectiveGuess what, I have grip again, though notes that she does have pain in the hand, feels that she is no longer concerned to the point where she feels the need to follow-up with a hand specialist. Also notes that she has not really been experiencing any shoulder pain as of late.ObjectiveTreatment Provided This VisitCPT Code Interventions Timed Minutes Untimed Minutes Total Minutes Therapeutic Exercise (97110) -UBE X 2 min each way for active warm-upIsometric walkouts R shoulder IR/ER with Red TB, 3 steps out x10 reps for eachScap clocks with Red TB around clock face and back x2YTIs prone over green physio ball (placed on low table) with 2# dumbbells x10 in each direction  25  25 N/A     N/A     N/A       Total Treatment Time: 25 AssessmentPatient tolerated progression of exercise program as noted above well, maintaining proper form without c/o increased pain though she does note challenge with muscle fatigue. PlanFrequency:  2x per weekPlan for Next VisitReassess PSFS next visit to determine progress towards goals. Rhae Lerner. Dewitte Vannice, PT, MPTCT Lic. #[id number]

## 2019-12-04 NOTE — Other
[facility] CENTER REHABILITATION [facility] [address], [address] [place] [address]GEXBM Number: [phone] Number: [phone] Therapy Daily Note5/13/2021Patient Name:  Anne Nott LevinMedical Record Number:  [id number] Date of Birth:  10-06-1992Therapist:  Loistine Chance Rondal Vandevelde, PTReferring Provider:  Cherlyn Labella, MDICD-10 Diagnosis(es):Problem List         ICD-10-CM   03/23/2019-Right shoulder,PT  Right shoulder pain M25.511  General InformationTherapy Episode of Care  Date of Visit:  11/15/2019   Treatment Number:  3 (no auth for 2x week)   Date the Treatment Plan was Initiated/Reviewed:  11/08/2019  Start of Care Date:  11/08/2019   Progress Report Due Date:  01/08/2020 Precautions/Limitations   Precautions/Limitations:  Standard precautionsInterpreter Services   Interpreter Services Utilized?  NoCognition / Learning Assessment   Primary Learner Relationship:  Patient        Barriers to learning:  No barriers        Preferred language:  English        Preferred learning style:  Listening and Pictures/VideoSubjectivePt reports that she has been a little sore since the last treatment session, ObjectiveMMT R shoulder Flexion 4-/5Abduction 4-/5Treatment Provided This VisitCPT Code Interventions Timed Minutes Untimed Minutes Total Minutes Therapeutic Exercise (97110) -UBE X 2 min each way for active warm-up-supine multidirectional rhythmic stabilization with R shoulder at 90 degrees of flex. IR/ER, and then biceps/triceps with shoulder in neutral and elbow flexed to 90.Isometric walkouts R shoulder IR/ER with Red TB, 3 steps out x10 reps for eachScap clocks with Red TB around clock face and back YTIs prone over green physio ball (placed on low table) with 1# cuff weights at wrists x10 in each direction  25  25 Manual Therapy (97140) n/a    N/A     N/A       Total Treatment Time: 25 AssessmentPt demonstrated HEP compliance however held t-band this session due dog bite to R hand. Some minor swelling noted R hand and area of location of dog bite but no open areas seen this session as it has been healing .Encouraged to seek medical attention if this gets worse. Difficulty with index finger flex noted this session, but otherwise tolerated treatment session well throughout, maintaining proper form without c/o increased pain.While there is an apparent decline in strength of R shoulder flexion and abduction, this may be due simply to inter-tester reliability, as she otherwise seems to be responding well to current treatment plan.PlanFrequency:  2x per weekPlan for Next VisitContinue to progress as tolerated.Rhae Lerner. Jennessa Trigo, PT, MPTCT Lic. #[id number]

## 2019-12-06 ENCOUNTER — Inpatient Hospital Stay: Admit: 2019-12-06 | Discharge: 2019-12-06 | Payer: MEDICAID | Primary: Internal Medicine

## 2019-12-07 ENCOUNTER — Telehealth: Admit: 2019-12-07 | Payer: PRIVATE HEALTH INSURANCE | Primary: Internal Medicine

## 2019-12-07 NOTE — Telephone Encounter
Spoke to patient who states she will call when ready to schedule future appointments as she is having a procedure done soon. POC through 7/6

## 2019-12-07 NOTE — Telephone Encounter
-----   Message from Loistine Chance Prior, PT sent at 12/06/2019  4:35 PM EDT -----Regarding: scheduling more future appointments.POC through 7/6 but only scheduled through 6/17. Please schedule more visits.

## 2019-12-11 ENCOUNTER — Inpatient Hospital Stay: Admit: 2019-12-11 | Discharge: 2019-12-11 | Payer: MEDICAID | Primary: Internal Medicine

## 2019-12-11 DIAGNOSIS — M25511 Pain in right shoulder: Secondary | ICD-10-CM

## 2019-12-11 NOTE — Other
[facility] CENTER REHABILITATION [facility] [place], [address] [place] [address]UEAVW Number: [phone] Number: [phone] Therapy Daily Note6/8/2021Patient Name:  Anne Windom LevinMedical Record Number:  [id number] Date of Birth:  1992/02/02Therapist:  Leodis Sias, PTAReferring Provider:  Cherlyn Labella, MDICD-10 Diagnosis(es):Problem List         ICD-10-CM   03/23/2019-Right shoulder,PT  * (Principal) Right shoulder pain M25.511  General InformationTherapy Episode of Care  Date of Visit:  12/11/2019   Treatment Number:  6 (no auth for 2x week)   Date the Treatment Plan was Initiated/Reviewed:  11/08/2019  Start of Care Date:  11/08/2019   Progress Report Due Date:  01/08/2020 Precautions/Limitations   Precautions/Limitations:  Standard precautionsInterpreter Services   Interpreter Services Utilized?  NoCognition / Learning Assessment   Primary Learner Relationship:  Patient        Barriers to learning:  No barriers        Preferred language:  English        Preferred learning style:  Listening and Pictures/VideoSubjectiveFeeling a little sore today.Feeling a little more stable in her shoulder but only sometimes. I still feel that weird pop in my shoulder sometimesShe reports that she is able to sleep on her R side now but tries not to sleep on it for too longTreatment Provided This VisitCPT Code Interventions Timed Minutes Untimed Minutes Total Minutes Therapeutic Exercise (97110) -UBE X 2 min each way-T's, Y's and B shoulder ext  with 2 lbs 2X10 each with green ball on table-red t-band wall clocks X2-hook lying serratus punches 2X10 with 3 lbs-hook lying D2 PNF with 1 lb 2X10 23  23 N/A     N/A     N/A       Total Treatment Time: 23 AssessmentPt is compliant with t-band at home and felt well with added exercises this session. More stability/control noted with resisted exercises as compared to the start of PTPlanFrequency:  2x per weekPlan for Next VisitProgress exercises for shoulder/scap strength and stabilityLisa Lawerance Bach, Sports coach License # [id number]

## 2019-12-13 ENCOUNTER — Inpatient Hospital Stay: Admit: 2019-12-13 | Discharge: 2019-12-13 | Payer: MEDICAID | Primary: Internal Medicine

## 2019-12-13 DIAGNOSIS — M25511 Pain in right shoulder: Secondary | ICD-10-CM

## 2019-12-13 NOTE — Other
[facility] CENTER REHABILITATION [facility] [place], [address] [place] [address]UEAVW Number: [phone] Number: [phone] Therapy Daily Note6/10/2021Patient Name:  Anne Handler LevinMedical Record Number:  [id number] Date of Birth:  [birth year]Therapist:  Miguel Rota, SPTReferring Provider:  Cherlyn Labella, MDICD-10 Diagnosis(es):Problem List         ICD-10-CM   03/23/2019-Right shoulder,PT  Right shoulder pain M25.511  General InformationTherapy Episode of Care  Date of Visit:  12/13/2019   Treatment Number:  6 (no auth for 2x week)   Date the Treatment Plan was Initiated/Reviewed:  11/08/2019  Start of Care Date:  11/08/2019   Progress Report Due Date:  01/08/2020 Precautions/Limitations   Precautions/Limitations:  Standard precautionsInterpreter Services   Interpreter Services Utilized?  NoCognition / Learning Assessment   Primary Learner Relationship:  Patient        Barriers to learning:  No barriers        Preferred language:  English        Preferred learning style:  Listening and Pictures/VideoSubjectivePatient reported she has not been doing exercise last week because she was busy with work. She reported some soreness of shoulder.Treatment Provided This VisitCPT Code Interventions Timed Minutes Untimed Minutes Total Minutes Therapeutic Exercise (97110) -UBE X 2 min each way-UE D2 PNF with Red Band B3 lbs empty can 10 x 2 B3 lbs full can 10 x 2 BSerratus anterior with red and green tube 10 x 2 B-Over head extension with red band 10 x 2 B 30  30 N/A     N/A     N/A       Total Treatment Time: 30 AssessmentPt tolerates the exercises well and expressed the exercises are at the right challenge. Patient needs verbal cues for postural control during exercise.  Patient education on HEP. for strengthening : (8-12 reps 2-3 sets).PlanFrequency:  2x per weekPlan for Next VisitContinue exercises for shoulder/scap strength and stability. Education on proper postural control for exercises.Miguel Rota,  SPT

## 2019-12-18 ENCOUNTER — Inpatient Hospital Stay: Admit: 2019-12-18 | Discharge: 2019-12-18 | Payer: MEDICAID | Primary: Internal Medicine

## 2019-12-18 ENCOUNTER — Telehealth: Admit: 2019-12-18 | Payer: PRIVATE HEALTH INSURANCE | Attending: Obstetrics and Gynecology | Primary: Internal Medicine

## 2019-12-18 DIAGNOSIS — M25511 Pain in right shoulder: Secondary | ICD-10-CM

## 2019-12-18 NOTE — Telephone Encounter
Spoke with patient to go over NPO and arrival details.  Patient completed labs on 5/31, needs to schedule her covid test, gave her the number.  Scheduled post-op.

## 2019-12-18 NOTE — Other
[facility] CENTER REHABILITATION [facility] [place], [address] [place] [address]IONGE Number: [phone] Number: [phone] Therapy Daily Note6/15/2021Patient Name:  Anne Beel LevinMedical Record Number:  [id number] Date of Birth:  1992-10-09Therapist:  Loistine Chance Trisha Ken, PTReferring Provider:  Cherlyn Labella, MDICD-10 Diagnosis(es):Problem List         ICD-10-CM   03/23/2019-Right shoulder,PT  * (Principal) Right shoulder pain M25.511  General InformationTherapy Episode of Care  Date of Visit:  12/18/2019   Treatment Number:  7 (no auth for 2x week)   Date the Treatment Plan was Initiated/Reviewed:  11/08/2019  Start of Care Date:  11/08/2019   Progress Report Due Date:  01/08/2020 Precautions/Limitations   Precautions/Limitations:  Standard precautionsInterpreter Services   Interpreter Services Utilized?  NoCognition / Learning Assessment   Primary Learner Relationship:  Patient        Barriers to learning:  No barriers        Preferred language:  English        Preferred learning style:  Listening and Pictures/VideoSubjectivePatient reports that the shoulder has been feeling good, though notes that it still fatigues with exercise and c/o a little soreness in the Ant. Jt. Line of R shoulder. Treatment Provided This VisitCPT Code Interventions Timed Minutes Untimed Minutes Total Minutes Therapeutic Exercise (97110) - UBE X 2 min each wayUE D2 PNF with Red Band, 2x10 each side 3 lbs empty can x 10, then 2 lbs x10 B3 lbs full can 10 x2 BSerratus anterior punch with blue T-band 10 x 2, in standing-Over head tricep extension with red band 10 x 2 B 25  25 N/A     N/A     N/A       Total Treatment Time: 25 AssessmentPatient with significant discomfort with empty can, which is why it was performed with lesser weight for second set, and then she was able to tolerate more easily. She also noted increased pain during serratus punch in standing at blue resistance. Patient was given the blue band to take home as well, but instructed to hold off on using it for HEP yet as a result. Cues were needed to maintain good spinal alignment during, as this also remains an issue for the patient.PlanFrequency:  2x per weekPlan for Next VisitReassess some objective measurements next session to determine progress towards goals. Decrease resistance for serratus punch to green T-Band.Rhae Lerner. Lluvia Gwynne, PT, MPTCT Lic. #[id number]

## 2019-12-20 ENCOUNTER — Inpatient Hospital Stay: Admit: 2019-12-20 | Discharge: 2019-12-20 | Payer: MEDICAID | Primary: Internal Medicine

## 2019-12-20 NOTE — Other
[facility] CENTER REHABILITATION [facility] [place], [address] [place] [address]VOZDG Number: [phone] Number: [phone] Therapy Daily Note6/17/2021Patient Name:  Anne Kozlov LevinMedical Record Number:  [id number] Date of Birth:  1992-02-22Therapist:  Leodis Sias, PTAReferring Provider:  Cherlyn Labella, MDICD-10 Diagnosis(es):Problem List         ICD-10-CM   03/23/2019-Right shoulder,PT  * (Principal) Right shoulder pain M25.511  General InformationTherapy Episode of Care  Date of Visit:  12/20/2019   Treatment Number:  9 (no auth for 2x week)   Date the Treatment Plan was Initiated/Reviewed:  11/08/2019  Start of Care Date:  11/08/2019   Progress Report Due Date:  01/08/2020 Precautions/Limitations   Precautions/Limitations:  Standard precautionsInterpreter Services   Interpreter Services Utilized?  NoCognition / Learning Assessment   Primary Learner Relationship:  Patient        Barriers to learning:  No barriers        Preferred language:  English        Preferred learning style:  Listening and Pictures/VideoSubjectivePt reports feeling well. She will have abdominal surgery on 12/28/2019. R shoulder gets sore with 3 lb empty cans so she is requesting 2 lbs as she did last sessionOBJECTIVE:PROM Right shoulder IR:  50        ( 30 When last assessed by PT) Right shoulder ER: 75        ( 30 When last assessed by PT)Treatment Provided This VisitCPT Code Interventions Timed Minutes Untimed Minutes Total Minutes Therapeutic Exercise (97110) Late by 20 min-UBE X 2 min each way-green t-band chest press 2X10-3 lb full can 2X10-2 lb empty can 2X10-Blue t-band horiz shoulder abd 2X10 back against wall-L side lying 2 lb ER on R 2X10 24  24 N/A     N/A     N/A       Total Treatment Time: 24 AssessmentObserved pt to have good form and body awareness during exercisesImproved ER/IRPlanFrequency:  2x per weekPlan for Next VisitPt instructed to get a note from the doctor following her surgery for continued PT following her procedure as she may have restrictionsProgress exercises for shoulder/scap strength and stabilityLisa Lawerance Bach, Sports coach License # [id number]

## 2019-12-24 ENCOUNTER — Ambulatory Visit: Admit: 2019-12-24 | Payer: PRIVATE HEALTH INSURANCE | Primary: Internal Medicine

## 2019-12-24 DIAGNOSIS — M25511 Pain in right shoulder: Secondary | ICD-10-CM

## 2019-12-25 ENCOUNTER — Inpatient Hospital Stay: Admit: 2019-12-25 | Discharge: 2019-12-25 | Payer: MEDICAID | Primary: Internal Medicine

## 2019-12-25 ENCOUNTER — Ambulatory Visit: Admit: 2019-12-25 | Payer: MEDICAID | Attending: Orthopedic Surgery | Primary: Internal Medicine

## 2019-12-25 ENCOUNTER — Encounter: Admit: 2019-12-25 | Payer: PRIVATE HEALTH INSURANCE | Attending: Orthopedic Surgery | Primary: Internal Medicine

## 2019-12-25 DIAGNOSIS — M75102 Unspecified rotator cuff tear or rupture of left shoulder, not specified as traumatic: Secondary | ICD-10-CM

## 2019-12-25 DIAGNOSIS — M778 Other enthesopathies, not elsewhere classified: Secondary | ICD-10-CM

## 2019-12-25 DIAGNOSIS — M75101 Unspecified rotator cuff tear or rupture of right shoulder, not specified as traumatic: Secondary | ICD-10-CM

## 2019-12-25 DIAGNOSIS — Z20822 Contact with and (suspected) exposure to covid-19: Secondary | ICD-10-CM

## 2019-12-25 DIAGNOSIS — Z9289 Personal history of other medical treatment: Secondary | ICD-10-CM

## 2019-12-25 DIAGNOSIS — A64 Unspecified sexually transmitted disease: Secondary | ICD-10-CM

## 2019-12-25 DIAGNOSIS — Z01812 Encounter for preprocedural laboratory examination: Secondary | ICD-10-CM

## 2019-12-25 DIAGNOSIS — D649 Anemia, unspecified: Secondary | ICD-10-CM

## 2019-12-25 DIAGNOSIS — M1990 Unspecified osteoarthritis, unspecified site: Secondary | ICD-10-CM

## 2019-12-25 DIAGNOSIS — F909 Attention-deficit hyperactivity disorder, unspecified type: Secondary | ICD-10-CM

## 2019-12-25 DIAGNOSIS — E282 Polycystic ovarian syndrome: Secondary | ICD-10-CM

## 2019-12-25 DIAGNOSIS — Z01818 Encounter for other preprocedural examination: Secondary | ICD-10-CM

## 2019-12-25 NOTE — Progress Notes
Patient follows up for bilateral shoulder pain.  She continues to exercises.  Overall better than last visit.  Complaints at this time patient has full active range of motion difficulty.  No evidence of instability.  Impingement.  Neurovascular intact.  Patient has done extremely well with her physical therapy.  She is cleared to return to all activities restriction.  She follow up with [place] on an as-needed basis going forward.

## 2019-12-26 ENCOUNTER — Encounter: Admit: 2019-12-26 | Payer: PRIVATE HEALTH INSURANCE | Attending: Obstetrics and Gynecology | Primary: Internal Medicine

## 2019-12-26 DIAGNOSIS — D649 Anemia, unspecified: Secondary | ICD-10-CM

## 2019-12-26 DIAGNOSIS — A64 Unspecified sexually transmitted disease: Secondary | ICD-10-CM

## 2019-12-26 DIAGNOSIS — E282 Polycystic ovarian syndrome: Secondary | ICD-10-CM

## 2019-12-26 DIAGNOSIS — M778 Other enthesopathies, not elsewhere classified: Secondary | ICD-10-CM

## 2019-12-26 DIAGNOSIS — K219 Gastro-esophageal reflux disease without esophagitis: Secondary | ICD-10-CM

## 2019-12-26 DIAGNOSIS — M1990 Unspecified osteoarthritis, unspecified site: Secondary | ICD-10-CM

## 2019-12-26 DIAGNOSIS — U071 COVID-19: Secondary | ICD-10-CM

## 2019-12-26 DIAGNOSIS — F909 Attention-deficit hyperactivity disorder, unspecified type: Secondary | ICD-10-CM

## 2019-12-26 DIAGNOSIS — N200 Calculus of kidney: Secondary | ICD-10-CM

## 2019-12-26 DIAGNOSIS — Z9289 Personal history of other medical treatment: Secondary | ICD-10-CM

## 2019-12-26 LAB — COVID-19 CLEARANCE OR FOR PLACEMENT ONLY: BKR SARS-COV-2 RNA (COVID-19) (YH): NEGATIVE

## 2019-12-26 MED ORDER — FERROUS GLUCONATE 324 MG (38 MG IRON) TABLET
324 mg (38 mg iron) | Freq: Three times a day (TID) | ORAL | Status: AC
Start: 2019-12-26 — End: 2019-12-26

## 2019-12-27 ENCOUNTER — Ambulatory Visit: Admit: 2019-12-27 | Payer: PRIVATE HEALTH INSURANCE | Attending: Anesthesiology | Primary: Internal Medicine

## 2019-12-27 DIAGNOSIS — N9489 Other specified conditions associated with female genital organs and menstrual cycle: Secondary | ICD-10-CM

## 2019-12-28 ENCOUNTER — Encounter: Admit: 2019-12-28 | Payer: PRIVATE HEALTH INSURANCE | Attending: Obstetrics and Gynecology | Primary: Internal Medicine

## 2019-12-28 ENCOUNTER — Ambulatory Visit: Admit: 2019-12-28 | Payer: PRIVATE HEALTH INSURANCE | Attending: Diagnostic Radiology | Primary: Internal Medicine

## 2019-12-28 ENCOUNTER — Ambulatory Visit: Admit: 2019-12-28 | Payer: PRIVATE HEALTH INSURANCE | Attending: Anesthesiology | Primary: Internal Medicine

## 2019-12-28 ENCOUNTER — Inpatient Hospital Stay: Admit: 2019-12-28 | Discharge: 2019-12-28 | Payer: MEDICAID | Primary: Internal Medicine

## 2019-12-28 DIAGNOSIS — Z9079 Acquired absence of other genital organ(s): Secondary | ICD-10-CM | POA: Insufficient documentation

## 2019-12-28 DIAGNOSIS — N809 Endometriosis, unspecified: Secondary | ICD-10-CM

## 2019-12-28 DIAGNOSIS — N803 Endometriosis of pelvic peritoneum: Secondary | ICD-10-CM

## 2019-12-28 DIAGNOSIS — Z30430 Encounter for insertion of intrauterine contraceptive device: Secondary | ICD-10-CM

## 2019-12-28 DIAGNOSIS — M1990 Unspecified osteoarthritis, unspecified site: Secondary | ICD-10-CM

## 2019-12-28 DIAGNOSIS — N200 Calculus of kidney: Secondary | ICD-10-CM

## 2019-12-28 DIAGNOSIS — F909 Attention-deficit hyperactivity disorder, unspecified type: Secondary | ICD-10-CM

## 2019-12-28 DIAGNOSIS — D649 Anemia, unspecified: Secondary | ICD-10-CM

## 2019-12-28 DIAGNOSIS — G8918 Other acute postprocedural pain: Secondary | ICD-10-CM

## 2019-12-28 DIAGNOSIS — Z91041 Radiographic dye allergy status: Secondary | ICD-10-CM

## 2019-12-28 DIAGNOSIS — N9489 Other specified conditions associated with female genital organs and menstrual cycle: Secondary | ICD-10-CM

## 2019-12-28 DIAGNOSIS — G9050 Complex regional pain syndrome I, unspecified: Secondary | ICD-10-CM

## 2019-12-28 DIAGNOSIS — N854 Malposition of uterus: Secondary | ICD-10-CM

## 2019-12-28 DIAGNOSIS — U071 COVID-19: Secondary | ICD-10-CM

## 2019-12-28 DIAGNOSIS — N7011 Chronic salpingitis: Secondary | ICD-10-CM

## 2019-12-28 DIAGNOSIS — Z9109 Other allergy status, other than to drugs and biological substances: Secondary | ICD-10-CM

## 2019-12-28 DIAGNOSIS — K219 Gastro-esophageal reflux disease without esophagitis: Secondary | ICD-10-CM

## 2019-12-28 DIAGNOSIS — E282 Polycystic ovarian syndrome: Secondary | ICD-10-CM

## 2019-12-28 DIAGNOSIS — M778 Other enthesopathies, not elsewhere classified: Secondary | ICD-10-CM

## 2019-12-28 DIAGNOSIS — A64 Unspecified sexually transmitted disease: Secondary | ICD-10-CM

## 2019-12-28 DIAGNOSIS — Z8616 Personal history of COVID-19: Secondary | ICD-10-CM

## 2019-12-28 DIAGNOSIS — Z9289 Personal history of other medical treatment: Secondary | ICD-10-CM

## 2019-12-28 HISTORY — PX: LAPAROSCOPIC UNILATERAL SALPINGECTOMY: SHX5934

## 2019-12-28 HISTORY — DX: Endometriosis, unspecified: N80.9

## 2019-12-28 MED ORDER — LEVONORGESTREL 21 MCG/24 HOURS (8 YRS) 52 MG INTRAUTERINE DEVICE
21 mcg/24 hours (8 yrs) 52 mg | Status: CP
Start: 2019-12-28 — End: ?

## 2019-12-28 MED ORDER — BUPIVACAINE (PF) 0.5 % (5 MG/ML) INJECTION SOLUTION
0.5 % (5 mg/mL) | Status: CP
Start: 2019-12-28 — End: ?

## 2019-12-28 MED ORDER — PROPOFOL 10 MG/ML INTRAVENOUS EMULSION
10 mg/mL | Status: DC | PRN
Start: 2019-12-28 — End: 2019-12-28
  Administered 2019-12-28 (×2): 10 mg/mL via INTRAVENOUS

## 2019-12-28 MED ORDER — INDIGOTINDISULFONATE SODIUM 8 MG/ML (0.8 %) INJECTION SOLUTION
8 mg/mL (0. %) | Status: DC | PRN
Start: 2019-12-28 — End: 2019-12-28
  Administered 2019-12-28: 13:00:00 8 mg/mL (0. %) via INTRAVESICAL

## 2019-12-28 MED ORDER — SODIUM CHLORIDE 0.9 % (FLUSH) INJECTION SYRINGE
0.9 % | INTRAVENOUS | Status: DC | PRN
Start: 2019-12-28 — End: 2019-12-28

## 2019-12-28 MED ORDER — INDIGOTINDISULFONATE SODIUM 8 MG/ML (0.8 %) INJECTION SOLUTION
8 mg/mL (0. %) | Status: CP
Start: 2019-12-28 — End: ?

## 2019-12-28 MED ORDER — KETOROLAC 30 MG/ML (1 ML) INJECTION SOLUTION
30 mg/mL (1 mL) | Status: DC | PRN
Start: 2019-12-28 — End: 2019-12-28
  Administered 2019-12-28: 14:00:00 30 mg/mL (1 mL) via INTRAVENOUS

## 2019-12-28 MED ORDER — SUGAMMADEX 100 MG/ML INTRAVENOUS SOLUTION
100 mg/mL | Status: DC | PRN
Start: 2019-12-28 — End: 2019-12-28
  Administered 2019-12-28: 14:00:00 100 mg/mL via INTRAVENOUS

## 2019-12-28 MED ORDER — DIPHENHYDRAMINE 50 MG/ML INJECTION SOLUTION
50 mg/mL | Freq: Once | INTRAVENOUS | Status: DC | PRN
Start: 2019-12-28 — End: 2019-12-28

## 2019-12-28 MED ORDER — LEVONORGESTREL 21 MCG/24 HOURS (8 YRS) 52 MG INTRAUTERINE DEVICE
21 mcg/24 hours (8 yrs) 52 mg | Status: DC | PRN
Start: 2019-12-28 — End: 2019-12-28
  Administered 2019-12-28: 13:00:00 21 mcg/24 hours (8 yrs) 52 mg via INTRAUTERINE

## 2019-12-28 MED ORDER — ACETAMINOPHEN 500 MG TABLET
500 mg | Freq: Once | ORAL | Status: CP
Start: 2019-12-28 — End: ?
  Administered 2019-12-28: 11:00:00 500 mg via ORAL

## 2019-12-28 MED ORDER — CEFAZOLIN 1 GRAM SOLUTION FOR INJECTION
1 gram | Status: DC | PRN
Start: 2019-12-28 — End: 2019-12-28
  Administered 2019-12-28: 12:00:00 1 gram via INTRAVENOUS

## 2019-12-28 MED ORDER — ONDANSETRON HCL (PF) 4 MG/2 ML INJECTION SOLUTION
4 mg/2 mL | Status: DC | PRN
Start: 2019-12-28 — End: 2019-12-28
  Administered 2019-12-28: 12:00:00 4 mg/2 mL via INTRAVENOUS

## 2019-12-28 MED ORDER — HYDROMORPHONE 0.5 MG/0.5 ML INJECTION SYRINGE
0.50.5 mg/ mL | INTRAVENOUS | Status: DC | PRN
Start: 2019-12-28 — End: 2019-12-28

## 2019-12-28 MED ORDER — FENTANYL (PF) 50 MCG/ML INJECTION SOLUTION
50 mcg/mL | Status: CP
Start: 2019-12-28 — End: ?

## 2019-12-28 MED ORDER — MIDAZOLAM (PF) 1 MG/ML INJECTION SOLUTION
1 mg/mL | Status: DC | PRN
Start: 2019-12-28 — End: 2019-12-28
  Administered 2019-12-28: 12:00:00 1 mg/mL via INTRAVENOUS

## 2019-12-28 MED ORDER — BUPIVACAINE (PF) 0.25 % (2.5 MG/ML) INJECTION SOLUTION
0.25 % (2.5 mg/mL) | Status: DC | PRN
Start: 2019-12-28 — End: 2019-12-28
  Administered 2019-12-28 (×2): 0.25 % (2.5 mg/mL) via PERINEURAL

## 2019-12-28 MED ORDER — FENTANYL (PF) 50 MCG/ML INJECTION SOLUTION
50 mcg/mL | INTRAVENOUS | Status: DC | PRN
Start: 2019-12-28 — End: 2019-12-28

## 2019-12-28 MED ORDER — ACETAMINOPHEN 500 MG TABLET
500 mg | Status: CP
Start: 2019-12-28 — End: ?

## 2019-12-28 MED ORDER — ACETAMINOPHEN ER 650 MG TABLET,EXTENDED RELEASE
650 mg | ORAL_TABLET | Freq: Three times a day (TID) | ORAL | 1 refills | Status: AC | PRN
Start: 2019-12-28 — End: ?

## 2019-12-28 MED ORDER — FENTANYL (PF) 50 MCG/ML INJECTION SOLUTION
50 mcg/mL | INTRAVENOUS | Status: DC | PRN
Start: 2019-12-28 — End: 2019-12-28
  Administered 2019-12-28: 15:00:00 50 mL via INTRAVENOUS

## 2019-12-28 MED ORDER — OXYCODONE-ACETAMINOPHEN 5 MG-325 MG TABLET
5-325 mg | Freq: Once | ORAL | Status: DC | PRN
Start: 2019-12-28 — End: 2019-12-28

## 2019-12-28 MED ORDER — PROPOFOL 10 MG/ML INTRAVENOUS EMULSION
10 mg/mL | Status: DC | PRN
Start: 2019-12-28 — End: 2019-12-28
  Administered 2019-12-28: 12:00:00 10 mL/h via INTRAVENOUS

## 2019-12-28 MED ORDER — OXYCODONE IMMEDIATE RELEASE 5 MG TABLET
5 mg | ORAL_TABLET | ORAL | 1 refills | Status: AC | PRN
Start: 2019-12-28 — End: ?

## 2019-12-28 MED ORDER — LIDOCAINE (PF) 100 MG/5 ML (2 %) INTRAVENOUS SYRINGE
100 mg/5 mL (2 %) | Status: DC | PRN
Start: 2019-12-28 — End: 2019-12-28
  Administered 2019-12-28: 12:00:00 100 mg/5 mL (2 %) via INTRAVENOUS

## 2019-12-28 MED ORDER — IBUPROFEN 800 MG TABLET
800 mg | ORAL_TABLET | Freq: Three times a day (TID) | ORAL | 1 refills | Status: AC
Start: 2019-12-28 — End: ?

## 2019-12-28 MED ORDER — FENTANYL (PF) 50 MCG/ML INJECTION SOLUTION
50 mcg/mL | Status: DC | PRN
Start: 2019-12-28 — End: 2019-12-28
  Administered 2019-12-28 (×2): 50 mcg/mL via INTRAVENOUS

## 2019-12-28 MED ORDER — SODIUM CHLORIDE 0.9 % (FLUSH) INJECTION SYRINGE
0.9 % | Freq: Three times a day (TID) | INTRAVENOUS | Status: DC
Start: 2019-12-28 — End: 2019-12-28

## 2019-12-28 MED ORDER — ROCURONIUM 10 MG/ML INTRAVENOUS SOLUTION
10 mg/mL | Status: DC | PRN
Start: 2019-12-28 — End: 2019-12-28
  Administered 2019-12-28 (×2): 10 mg/mL via INTRAVENOUS

## 2019-12-28 MED ORDER — LACTATED RINGERS INTRAVENOUS SOLUTION
Status: DC | PRN
Start: 2019-12-28 — End: 2019-12-28
  Administered 2019-12-28 (×2): via INTRAVENOUS

## 2019-12-28 MED ORDER — ONDANSETRON HCL (PF) 4 MG/2 ML INJECTION SOLUTION
42 mg/2 mL | INTRAVENOUS | Status: DC | PRN
Start: 2019-12-28 — End: 2019-12-28

## 2019-12-28 MED ORDER — LACTATED RINGERS INTRAVENOUS SOLUTION
INTRAVENOUS | Status: DC
Start: 2019-12-28 — End: 2019-12-28
  Administered 2019-12-28: 11:00:00 1000.000 mL/h via INTRAVENOUS

## 2019-12-28 MED ORDER — DEXAMETHASONE SODIUM PHOSPHATE 10 MG/ML INJECTION SOLUTION
10 mg/mL | Status: DC | PRN
Start: 2019-12-28 — End: 2019-12-28
  Administered 2019-12-28: 12:00:00 10 mg/mL via INTRAVENOUS
  Administered 2019-12-28 (×2): 10 mg/mL via PERINEURAL

## 2019-12-28 MED ORDER — MIDAZOLAM (PF) 1 MG/ML INJECTION SOLUTION
1 mg/mL | Status: CP
Start: 2019-12-28 — End: ?

## 2019-12-28 NOTE — Anesthesia Pre-Procedure Evaluation
This is a 29 y.o. female scheduled for LAPAROSCOPY, SURGICAL; W/REMOVAL, ADNEXAL STRUCTURES (N/A ).Review of Systems/ Medical HistoryPatient summary, nursing notes, EKG/Cardiac Studies , Labs, pre-procedure vitals, height, weight and NPO status reviewed.No previous anesthesia concernsAnesthesia Evaluation: Perioperative Screening ToolPONV Screening risk factors: non-smoker, female and surgery >60 minutes.Estimated body mass index is 20.99 kg/m? as calculated from the following:  Height as of this encounter: 5' 7 (1.702 m).  Weight as of this encounter: 60.8 kg. CC/HPI: ADHD	ArthritisAnemia	PCOS (polycystic ovarian syndrome)History of blood transfusion	STD (sexually transmitted disease)Tendonitis of shoulder, right	GERD (gastroesophageal reflux disease)Kidney stone	COVID-19Past Surgical History:  Wisdom tooth extractionCardiovascular: Negative   -Exercise tolerance: >4 METS -Vascular Disease:  Negative   Respiratory:  Negative.HEENT: Negative.Neuromuscular: -Muscle disorders: complex regional pain syndrome/RSDS Skeletal/Skin:  -Joint and Skeletal Disorders: arthritisGastrointestinal/Genitourinary: -Gastrointestinal Disorders:  Patient has GERD. Her GERD is well controlled.Hematological/Lymphatic: -Anemia: Patient has anemia.  -Coagulopathy:  Patient has blood dyscrasia.Behavioral/Psychiatric & Syndromes:  Patient has attention deficit hyperactivity disorder.Additional Findings: Labs within normal limitsHb-11.8Hct-35.8Normal PFT from 5/20EKG- NSR 95 bpmPhysical ExamCardiovascular:  Rhythm: regularPulmonary:  normal exam  Patient's breath sounds clear to auscultationAirway:  Mallampati: IITM distance: >3 FBNeck ROM: fullDental:  normal exam  Anesthesia PlanASA 1 The primary anesthesia plan is  general ETT. Perioperative Code Status confirmed: It is my understanding that the patient is currently designated as 'Full Code' and will remain so throughout the perioperative period.Anesthesia informed consent obtained. Consent obtained from: patientUse of blood products: consented    Consent Comment: Consented for regional anesthesiaRisks, Benefits, alternatives discussed. Risks discussed include bleeding, infection, nerve damage.Pt understands and agrees to proceedThe post operative pain plan is IV analgesics, per surgeon management and perineural.The post operative pain plan was performed at surgeon's request.Plan discussed with Attending and SRNA.Anesthesiologist's Pre Op NoteI personally evaluated and examined the patient prior to the intra-operative phase of care.

## 2019-12-28 NOTE — Utilization Review (ED)
UM Status: Meets Outpatient Status PR LAP,RMV ADNEXAL STRUCTURE [[id number]]UEAVWUJW DiMichele Eulah Pont, BSN, RNUtilization ManagementMHB [phone]

## 2019-12-28 NOTE — Other
Operative Diagnosis:Pre-op:   * No pre-op diagnosis entered * Patient Coded Diagnosis   Pre-op diagnosis: Adnexal mass  Post-op diagnosis: Adnexal mass  Patient Diagnosis   None    Post-op diagnosis:   * Adnexal mass [N94.89]Operative Procedure(s) :Procedure(s) (LRB):LAPAROSCOPY, SURGICAL; W/REMOVAL, ADNEXAL STRUCTURES; IUD insertion (N/A)Post-op Procedure & Diagnosis ConfirmationPost-op Diagnosis: Post-op Diagnosis confirmed (no changes)Post-op Procedure: Post-op Procedure confirmed (no changes)

## 2019-12-28 NOTE — Other
Post Anesthesia Transfer of Care NotePatient: Anne Josette LevinProcedure(s) Performed: Procedure(s) (LRB):LAPAROSCOPY, SURGICAL; W/REMOVAL, ADNEXAL STRUCTURES; IUD insertion (N/A) Patient location: PACU Last Vitals: Vitals Value Taken Time BP 136/82 12/28/19 1000 Temp 36.6 ?C 12/28/19 0958 Pulse 73 12/28/19 1004 Resp 16 12/28/19 1004 SpO2 100 % 12/28/19 1004 Vitals shown include unvalidated device data.Level of consciousness: responds to stimulationTransport Vital Signs:  Stable since the last set of recorded intra-operative vital signsComplications: noneIntra-operative Intake & Output and Antibiotics as per Anesthesia record and discussed with the RN.

## 2019-12-28 NOTE — Discharge Instructions
Patient Education Salpingectomy, Care AfterThis sheet gives you information about how to care for yourself after your procedure. Your health care provider may also give you more specific instructions. If you have problems or questions, contact your health care provider.What can I expect after the procedure?After the procedure, it is common to have:?	Pain in your abdomen.?	Some light vaginal bleeding (spotting) for a few days.?	Tiredness.Your recovery time will vary depending on which method your surgeon used for your surgery.Follow these instructions at home:Incision care?	Follow instructions from your health care provider about how to take care of your incisions. Make sure you:?	Wash your hands with soap and water before and after you change your bandage (dressing). If soap and water are not available, use hand sanitizer.?	Change or remove your dressing as told by your health care provider.?	Leave any stitches (sutures), skin glue, or adhesive strips in place. These skin closures may need to stay in place for 2 weeks or longer. If adhesive strip edges start to loosen and curl up, you may trim the loose edges. Do not remove adhesive strips completely unless your health care provider tells you to do that.?	Keep your dressing clean and dry.?	Check your incision area every day for signs of infection. Check for:?	Redness, swelling, or pain that gets worse.?	Fluid or blood.?	Warmth.?	Pus or a bad smell.  Activity?	Rest as told by your health care provider.?	Avoid sitting for a long time without moving. Get up to take short walks every 1-2 hours. This is important to improve blood flow and breathing. Ask for help if you feel weak or unsteady.?	Return to your normal activities as told by your health care provider. Ask your health care provider what activities are safe for you.?	Do not drive until your health care provider says that it is safe.?	Do not lift anything that is heavier than 10 lb (4.5 kg), or the limit that you are told, until your health care provider says that it is safe. This may be 2-6 weeks depending on your surgery.?	Until your health care provider approves:?	Do not douche.?	Do not use tampons.?	Do not have sex.Medicines?	Take over-the-counter and prescription medicines only as told by your health care provider.?	Ask your health care provider if the medicine prescribed to you:?	Requires you to avoid driving or using heavy machinery.?	Can cause constipation. You may need to take actions to prevent or treat constipation, such as:?	Drink enough fluid to keep your urine pale yellow.?	Take over-the-counter or prescription medicines.?	Eat foods that are high in fiber, such as beans, whole grains, and fresh fruits and vegetables.?	Limit foods that are high in fat and processed sugars, such as fried or sweet foods.General instructions?	Wear compression stockings as told by your health care provider. These stockings help to prevent blood clots and reduce swelling in your legs.?	Do not use any products that contain nicotine or tobacco, such as cigarettes, e-cigarettes, and chewing tobacco. If you need help quitting, ask your health care provider.?	Do not take baths, swim, or use a hot tub until your health care provider approves. You may take showers.?	Keep all follow-up visits as told by your health care provider. This is important.Contact a health care provider if you have:?	Pain when you urinate.?	Redness, swelling, or pain around an incision.?	Fluid or blood coming from an incision.?	Pus or a bad smell coming from an incision.?	An incision that feels warm to the touch.?	A fever.?	Abdominal pain that gets worse or does not get better with medicine.?	An incision that starts to break open.?	A rash.?	Light-headedness.?	Nausea and vomiting.Get help right away if you:?	Have pain in your chest or leg.?	Develop shortness of breath.?	Faint.?	Have increased or heavy vaginal bleeding, such as soaking a pad in an hour.Summary?	After   it is common to feel tired, have some pain in your abdomen, and have some light vaginal bleeding for a few days.?	Follow instructions from your health care provider about how to take care of your incisions.?	Return to your normal activities as told by your health care provider. Ask your health care provider what activities are safe for you.?	Do not douche, use tampons, or have sex until your health care provider approves.?	Keep all follow-up visits as told by your health care provider.This information is not intended to replace advice given to you by your health care provider. Make sure you discuss any questions you have with your health care provider.Document Released: 09/25/2010 Document Revised: 06/12/2018 Document Reviewed: 12/09/2019Elsevier Patient Education ? 2020 Elsevier Inc.

## 2019-12-28 NOTE — H&P
All the risks/benefits and alternatives of laparoscopy, salpingectomy, possible bilateral salpingectomy, removal of endometriosis, LOA, Mirena insertiond/w pt. Patient understand that the risks of surgery including but not  limited to bleeding, infection, risk of damage to surrounding structures or organs including the bowel, the bladder, ureter, blood vessels, nerves, and ect,? She knows she might need IVf to get pregnantrisk of DVT, PE,and anesthesia D/W patient, plus conversion to laparotomy was discussed. All the questions were answered. Pt. verbalized an understanding of all of the above and desires to proceed with surgery as described. All alternate, diagnostic and therapeutic options with their risks, failure rates, consequences and complications, recurrence rates, recovery rates, etc,?. Have been discussed with the patient.Informed consent was obtained.[facility]	GYN History & PhysicalHPI: The Pt is a 29 y.o. G0P0000 female who presents today for surgical management of persistent pelvic pain, believed to be due to hydrosalpinx on imaging.  The various options for management were reviewed again with the patient, including expectant management vs medical management vs surgical management.  She desires to proceed with surgical management today. She denies any recent fevers/chills, unintended weight changes, trauma, abdominopelvic pain, nausea/vomiting, abnormal vaginal bleeding/discharge, dysuria/hematuria, or changes in bowel habits. She denies any personal or family history of bleeding disorders or problems with anesthesia.  Medical History: POB PGYN OB History Gravida Para Term Preterm AB Living 0 0 0 0 0 0 SAB TAB Ectopic Molar Multiple Live Births 0 0 0 0 0 0 Obstetric Comments Cervix dilation with possible miscarriage (not confirmed) 29yo   PMH PSH Past Medical History: Diagnosis Date ? ADHD  ? Anemia  ? Arthritis ? COVID-19 06/2019 ? GERD (gastroesophageal reflux disease)  ? History of blood transfusion  ? Kidney stone  ? PCOS (polycystic ovarian syndrome)  ? STD (sexually transmitted disease)  ? Tendonitis of shoulder, right   Rotator Cuff    Past Surgical History: Procedure Laterality Date ? WISDOM TOOTH EXTRACTION     Social History Family History History Substance Use Topics Social History Socioeconomic History ? Marital status: Single   Spouse name: Not on file ? Number of children: Not on file ? Years of education: Not on file ? Highest education level: Not on file Occupational History ? Not on file Social Needs ? Financial resource strain: Not on file ? Food insecurity   Worry: Not on file   Inability: Not on file ? Transportation needs   Medical: Not on file   Non-medical: Not on file Tobacco Use ? Smoking status: Never Smoker ? Smokeless tobacco: Never Used Substance and Sexual Activity ? Alcohol use: Yes   Comment: once a month  ? Drug use: No ? Sexual activity: Yes   Partners: Male   Birth control/protection: Patch   Comment: oral Lifestyle ? Physical activity   Days per week: Not on file   Minutes per session: Not on file ? Stress: Not on file Relationships ? Social Manufacturing systems engineer on phone: Not on file   Gets together: Not on file   Attends religious service: Not on file   Active member of club or organization: Not on file   Attends meetings of clubs or organizations: Not on file   Relationship status: Not on file ? Intimate partner violence   Fear of current or ex partner: Not on file   Emotionally abused: Not on file   Physically abused: Not on file   Forced sexual activity: Not on file Other Topics Concern ? Not on file  Social History Narrative ? Not on file  Family History    family history includes Diabetes in her maternal grandmother; High cholesterol in her maternal grandmother; Hypertension in her maternal grandmother; Prostate cancer in her maternal grandfather; Rheum arthritis in her maternal grandmother; Stroke in her maternal grandfather; Transient ischemic attack in her maternal grandfather. She was adopted. Medication:   No medications prior to admission.  Allergies Allergies Chapstick, Iodine, and Contrast dye [iodinated contrast media] Menstrual and Endocrine HistoryLMP Patient's last menstrual period was 12/05/2019 (approximate).  Objective: Vitals:Ht 5' 7 (1.702 m)  - Wt 60.8 kg  - LMP 12/05/2019 (Approximate)  - BMI 20.99 kg/m? Physical Exam: General: alert and oriented, cooperative, no acute distress Cardiac: Regular rate & rhythm, no murmurs, rubs or gallops Pulm: air entry equal and clear bilaterally, no wheezing/rhonchi, respirations unlaboured Abdomen: soft, positive bowel sounds, appropriately tender to palpation, non-distended, no guarding or rebound Extremities: no calf tendernessLabs: covid negative 6/225/31: cbc: 11.8/35.8/306Diagnostic Review:FINDINGS: ??????TRANSABDOMINAL/TRANSVAGINAL EXAM: ??????Uterus: ?Measures 6.8 x 4.9 x 3.1 cm. The uterus demonstrates normal in size and echotexture. The endometrium measures up to 7 mm. ?Right Ovary: ?Measures 3.0 x 1.5 x 1.9 cm. The ovary is unremarkable. There is normal vascularity.Interval decrease in size of ?right adnexa tubular structure with mild internal debris again compatible with hematosalpinx or complex hydrosalpinx.?Left Ovary: ?Measures 4.2 x 1.5 x 2.5 cm. The ovary is unremarkable. There is normal vascularity.?Peritoneum: There is no free fluid seen within the pelvis.????IMPRESSION: ??	Interval decrease in size of ?right adnexa tubular structure with mild internal debris, again compatible with hematosalpinx or complex hydrosalpinx.?Assessment/Plan: 29 y.o. G0P0000 female with right hydrosalpinx presenting for surgical management with diagnostic laparoscopy, possible right salpingectomy/oophorectomy/cystectomy, Lysis of adhesions, chromopertubation, possible Mirena IUD insertion and all other indicated procedures. Pt desires to proceed with planned surgery.?	Informed consent signed and in chart?	Pt has been NPO since midnight?	Pt expresses understanding and is in agreement with the plan.?	No antibiotics indicated at this timePatient discussed with Dr. Donnelly Stager, MD PGY3Obstetrics & GynecologyBridgeport HospitalY[facility]6/24/20212:59 PM

## 2019-12-28 NOTE — Other
[facility] CENTER REHABILITATION [facility] [place], [address] [address]BJSEG Number: [phone] Number: [phone] Therapy Daily Note6/3/2021Patient Name:  Anne Guyton LevinMedical Record Number:  [id number] Date of Birth:  [birth year]Therapist:  Loistine Chance Onna Nodal, PTReferring Provider:  Cherlyn Labella, MDICD-10 Diagnosis(es):Problem List         ICD-10-CM   03/23/2019-Right shoulder,PT  * (Principal) Right shoulder pain M25.511  General InformationTherapy Episode of Care  Date of Visit:  12/06/2019   Treatment Number:  5 (no auth for 2x week)   Date the Treatment Plan was Initiated/Reviewed:  11/08/2019  Start of Care Date:  11/08/2019   Progress Report Due Date:  01/08/2020 Precautions/Limitations   Precautions/Limitations:  Standard precautionsInterpreter Services   Interpreter Services Utilized?  NoCognition / Learning Assessment   Primary Learner Relationship:  Patient        Barriers to learning:  No barriers        Preferred language:  English        Preferred learning style:  Listening and Pictures/VideoSubjectivePatient notes that she has been working on her posture as previously instructed but was having a difficult time trying to do so in her car, stating,  it just made my neck feel really uncomfortable.ObjectiveScoliosis detected with unequal gap between arms and trunk (space is greater on R than L), though overall balance in the frontal plane is evident. She is unbalanced in the sagittal plane however, with and overall forward shift of the trunk, a sway back compensatory mechanism and severe forward head that is difficult to correct due to stiffness. There is also imbalance in the transverse plane with torso rotated to the R, and patient admitting that she sometimes feels that one shoulder (demonstrating the L) is sometimes higher than R as well. + ADAMs test for mild prominence in R thoracic region, + Modified ADAMs for RASO in the lumbar region.Treatment Provided This VisitCPT Code Interventions Timed Minutes Untimed Minutes Total Minutes Therapeutic Exercise (97110) -UBE X 2 min each way for active warm-up- Wall angels x2(5)- Open book B shoulder ER with LT activation, using red TB loop, maintaining 10 hold x10 HELD THIS VISIT:Scap clocks with Red TB around clock face and back x2YTIs prone over green physio ball (placed on low table) with 2# dumbbells x10 in each direction  12  12 Manual Therapy (97140) Mid-upper T/S P-A mobs Gr. III-IVRepetitive Retraction in Lying mobs 7  7 Neuromuscular Re-Education (16967) Reviewed postural corrections in standing utilizing wall for point of reference for sagittal plane, manually cuing the patient to correct the posture in the frontal plane as well. 18  18 N/A       Total Treatment Time: 37 AssessmentPatient's scoliotic posturing may be contributing to poor scapular mechanics, which is why time was taken this session to A the patient in correction. She demonstrates good understanding with these instructions, and willingness to continue working towards incorporation of proper posture into normal routine. PlanFrequency:  2x per weekPlan for Next VisitReassess PSFS next visit to determine progress towards goals, as it was N/A this visit.Rhae Lerner. Faelynn Wynder, PT, MPTCT Lic. #[id number]

## 2019-12-28 NOTE — Anesthesia Procedure Notes
Anesthesia Block:  TAPSPain Diagnosis/Location: abdomenThis block procedure is being performed for post-operative pain management at the request of:      Requesting Physician: Penelope Coop, SPre-procedure Checklistpatient identified; site marked; surgical consent reviewd; pre-op evaluation performed; IV checked; monitors and equipment checked; informed consent obtained and options, plan, risks & benefits discussed with patientUniversal ProtocolTiming: the time out is initiated after the patient is positioned and prior to the beginning of the procedure: YesName of patient and medical record number or DOB (if MRN is unavailable) stated and checked with ID band or        previously confirmed MEDICAL RECORD NUMBERYesProceduralist states or confirms the procedure to be performed: YesThe procedural consent is used to verify the procedure to be performed: YesRadiographic imaging is present or a laterality side/site band is present on patient and is accessible: YesMedications addressed: YesPatient's pre-procedure mental status:  Under general anesthesiaPrep: chloraprepNeedle A:  TAPSLaterality: bilateralInjection technique: single-shotNeedle: Short-bevel and Insulated      Gauge: 20 G      Length: 4 inTechnique: Ultrasound guided     - Imaging guidance was used to decrease the risk of complications. A permanent image has been stored in the patient's medical record. The needle tip was noted to be adjacent to the nerve/plexus identified. Appropriate spread of the medication was noted in real time. There was no ultrasound evidence of intravascular and/or intraneural injection. Event(s): blood not aspirated and no injection resistancePerformed By: , personally Outcome:  successful blockAttempts:  block completePatient tolerated block procedure well?:  yes

## 2019-12-29 NOTE — Anesthesia Post-Procedure Evaluation
Anesthesia Post-op NotePatient: Anne Josette LevinProcedure(s):  Procedure(s) (LRB):LAPAROSCOPY, SURGICAL; W/REMOVAL, ADNEXAL STRUCTURES; IUD insertion (N/A) Patient location: PACULast Vitals:  I have noted the vital signs as listed in the nursing notes.Mental status recovered: patient participates in evaluation: YesVital signs reviewed: YesRespiratory function stable:YesAirway is patent: YesCardiovascular function and hydration status stable: YesPain control satisfactory: YesNausea and vomiting control satisfactory:Yes

## 2019-12-31 NOTE — Other
Patient reports experiencing shortness of breath when she speaks, but not when she walks around. Patient was advised to call Dr. Lowella Grip to both make an appointment for follow up and for advice regarding SOB.

## 2020-01-01 ENCOUNTER — Telehealth: Admit: 2020-01-01 | Payer: PRIVATE HEALTH INSURANCE | Attending: Obstetrics and Gynecology | Primary: Internal Medicine

## 2020-01-01 NOTE — Telephone Encounter
Patient states after her procedure she was instructed to schedule with her primary care ob/gyn Trishia. Please confirm.Patient already has her post op visit scheduled. Also, she would like to discuss her recent shortness of breath.

## 2020-01-02 ENCOUNTER — Encounter: Admit: 2020-01-02 | Payer: PRIVATE HEALTH INSURANCE | Primary: Internal Medicine

## 2020-01-02 DIAGNOSIS — Z9289 Personal history of other medical treatment: Secondary | ICD-10-CM

## 2020-01-02 DIAGNOSIS — E282 Polycystic ovarian syndrome: Secondary | ICD-10-CM

## 2020-01-02 DIAGNOSIS — D649 Anemia, unspecified: Secondary | ICD-10-CM

## 2020-01-02 DIAGNOSIS — A64 Unspecified sexually transmitted disease: Secondary | ICD-10-CM

## 2020-01-02 DIAGNOSIS — F909 Attention-deficit hyperactivity disorder, unspecified type: Secondary | ICD-10-CM

## 2020-01-02 DIAGNOSIS — M25511 Pain in right shoulder: Secondary | ICD-10-CM

## 2020-01-02 DIAGNOSIS — U071 COVID-19: Secondary | ICD-10-CM

## 2020-01-02 DIAGNOSIS — M778 Other enthesopathies, not elsewhere classified: Secondary | ICD-10-CM

## 2020-01-02 DIAGNOSIS — M1990 Unspecified osteoarthritis, unspecified site: Secondary | ICD-10-CM

## 2020-01-02 DIAGNOSIS — N200 Calculus of kidney: Secondary | ICD-10-CM

## 2020-01-02 DIAGNOSIS — K219 Gastro-esophageal reflux disease without esophagitis: Secondary | ICD-10-CM

## 2020-01-14 ENCOUNTER — Encounter
Admit: 2020-01-14 | Payer: PRIVATE HEALTH INSURANCE | Attending: Student in an Organized Health Care Education/Training Program | Primary: Internal Medicine

## 2020-01-14 ENCOUNTER — Ambulatory Visit: Admit: 2020-01-14 | Payer: PRIVATE HEALTH INSURANCE | Attending: Obstetrics and Gynecology | Primary: Internal Medicine

## 2020-01-14 DIAGNOSIS — U071 COVID-19: Secondary | ICD-10-CM

## 2020-01-14 DIAGNOSIS — M778 Other enthesopathies, not elsewhere classified: Secondary | ICD-10-CM

## 2020-01-14 DIAGNOSIS — Z9889 Other specified postprocedural states: Secondary | ICD-10-CM

## 2020-01-14 DIAGNOSIS — D649 Anemia, unspecified: Secondary | ICD-10-CM

## 2020-01-14 DIAGNOSIS — E282 Polycystic ovarian syndrome: Secondary | ICD-10-CM

## 2020-01-14 DIAGNOSIS — M1990 Unspecified osteoarthritis, unspecified site: Secondary | ICD-10-CM

## 2020-01-14 DIAGNOSIS — N200 Calculus of kidney: Secondary | ICD-10-CM

## 2020-01-14 DIAGNOSIS — K219 Gastro-esophageal reflux disease without esophagitis: Secondary | ICD-10-CM

## 2020-01-14 DIAGNOSIS — F909 Attention-deficit hyperactivity disorder, unspecified type: Secondary | ICD-10-CM

## 2020-01-14 DIAGNOSIS — Z9289 Personal history of other medical treatment: Secondary | ICD-10-CM

## 2020-01-14 DIAGNOSIS — A64 Unspecified sexually transmitted disease: Secondary | ICD-10-CM

## 2020-01-14 MED ORDER — NORELGESTROMIN 150 MCG-E.ESTRADIOL 35 MCG/24 HR WEEKLY TRANSDERM PATCH
150-35 mcg/24 hr | MEDICATED_PATCH | TRANSDERMAL | 7 refills | Status: AC
Start: 2020-01-14 — End: 2021-01-15

## 2020-01-19 NOTE — Other
[facility] HealthOPERATIVE REPORTPatient Data:  Patient Name: Anne Yoder Age: 29 y.o. DOB: 09/20/1990	 MRN: [id number]	 DATE OF SURGERY: 6/25/2021OPERATION: Diagnostic exploratory laparoscopy, Right Salpingectomy, Biopsy of possible endometriotic lesion, chromopertubation, Mirena Intrauterine device insertionOPERATING ROOM STAFF:Surgeon(s) and Role:   Penelope Coop, Chapman Fitch, MD - PrimaryCirculator: Cherlyn Roberts, RNRelief Circulator: Manon Hilding, RNRelief Scrub: Wesley Desanctis, FerideScrub Person: [place], DajanaResident: Belia Heman, MD; Thompson Caul, MDANESTHESIA: General with endotracheal tubePRE-OP DIAGNOSIS: Chronic Pelvic pain, hydrosalpinxPOST-OP DIAGNOSIS: SamePROPHYLAXIS: 1.	Bilateral SCDs    ESTIMATED BLOOD LOSS: 10 ccFLUIDS REPLACED:  1000 cc Lactated Ringer'sOUTPUT: 200 cc clear yellow urineCOMPLICATIONS: None?OPERATIVE FINDINGS: 1.	Normal appearing external genitalia without masses or lesions2.	Mobile, anteverted uterus 3.	No palpable adnexal masses4.	Grossly normal intraabdominal viscera with atraumatic entry noted5.	Normal uterus, mildly dilated left fallopian tube which was noted to be patent on chromopertubation; right fallopian tube enlarged and tortuous, consistent with  hydrosalpinx6.	Possible superficial endometriosis lesions noted on right side wall (biopsied) and in posterior cul-de sac; biopsy sent to pathology.PATHOLOGY/SPECIMENS: ID Type Source Tests Collected by Time 1 : Right Fallopian tube Tissue Fallopian Tube, Right PATHOLOGY [facility] LMW [facility]) Genice Rouge, MD 12/28/2019  9:27 AM 2 : Right abdominal side wall Tissue Soft Tissue, Other PATHOLOGY [facility] LMW [facility]) Genice Rouge, MD 12/28/2019  9:28 AM ??PROCEDURE: After informed consent was obtained, the patient was taken to the operating room where she was placed supine upon the operating table. General anesthesia with endotracheal tube placement was obtained without difficulty. Time out was performed to correctly identify the patient and the procedure. Sterility was confirmed. Fire risk was deemed low. She was then placed in dorsal lithotomy position with Yellofin stirrups, prepped with chloraprep abdominally and Hibiclens on the perineum and vagina. She was draped in the usual sterile fashion. The patient was then examined under general anesthesia with the findings noted above. Next, a foley catheter was placed and drained clear, yellow urine. A weighted speculum was placed in the patient's vagina and the anterior lip of the cervix was grasped with a single toothed tenaculum. The uterus was sounded to 7cm and a Kronner uterine manipulator was then advanced into the uterus to provide a means of manipulation. The speculum and tenaculum were removed from the vagina.?Attention was then turned to the abdomen where a 2mm incision was mid in umbilicus and Veress needle, without sheath and gas attached on low flow was then entered into abdominal cavity while tenting up abdominal wall with towel clamps. Intraperitoneal placement was confirmed by a drop in intra-abdominal pressure. Insufflation of carbon dioxide gas followed. Pneumoperitoneum was obtained to a pressure of approximately 15 mm Hg with an opening pressure of approximately 1 mm Hg. A 5 mm trocar and sleeve were then advanced without difficulty into the abdomen where intra-abdominal placement was confirmed visually after insertion of a laparoscope. Survey of the entry point revealed an atraumatic entry.The abdominal viscera, including the uterus, bilateral ovaries appeared normal.  Left fallopian tube mildly dilated and right fallopian tube noted to have significant hydrosalpinx with paratubal cyst off fimbria about 1cm. A 5 mm right and left  transverse skin incisions were then made 2 cm above and medial to the anterior superior iliac spines. 5 mm  trocars and sleeves were subsequently advanced under direct visualization. The patient was placed in Trendelenburg position to assist in moving the abdominal viscera away from the pelvic organs. ?Using an atraumatic grasper, the bowel contents were carefully moved from the pelvis. An atraumatic bowel grasper was used to grasp  right tube. Ligasure introduced and right salpingectomy performed. Hemostasis noted. The 5mm port was then upsized to 10mm and specimen removed intact through port and sent to pathology.  At this time, Right side wall gunpowder colored lesion noted.  Laparoscopic scissors used to cut lesions off right side wall with Jola Babinski graspers elevating and specimen removed intact and sent to pathology. The camera was then advanced and indepth look for further endometriosis lesions performed and noted to have several small, superficial lesions in posterior cul-de-sac.  At this time, indigo carmine was injected through manipulator port and spillage easily and quickly seen through left tube.  The suction irrigator was then inserted to evacuate fluid. Using the LigaSure device, the side wall lesion was sealed and hemostasis noted.  Surgicel was placed ontop of area of biopsy, hemostasis confirmed again.  Seprafilm was then dissolved in saline and injected ontop of uterus to prevent further adhesions. ?Excellent hemostasis was noted upon reinspection of the salpingectomy site and on final survey of the abdominal cavity. ?Following completion of the procedure, pneumoperitoneum was evacuated as much as possible. Trocars and sleeves were removed from the abdomen. The 5 mm skin incisions were approximated and closed with 4-0 monocryl and steri strips. The 11 mm skin incision was first re-approximated with a deep using the Carter-Thomason device and skin closed with 4-0 monocryl. All instruments were removed from the vagina. Foley catheter was removed. No bleeding was noted at the tenaculum site. ?The patient tolerated the procedure well. Sponge, lap, and instrument counts were correct x2 at completion of the procedure. The patient was awoken, extubated without difficulty, and taken to the post-anesthesia care unit in stable condition. ?Dr. Lucina Mellow PGY1 and Dr. Penelope Coop was present and scrubbed for the duration of the procedure. ?Thompson Caul, MD PGY3Obstetrics & GynecologyBridgeport HospitalYale [facility]6/25/20219:48 AMAttending Addendum	I present for the entire duration of surgery. I  have reviewed the notes, assessments, and/or procedures performed by the resident. I concur with her/his documentation of Dalissa Amita Atayde. Darrow Barreiro M Kashani7/17/2021 1:30 AM

## 2020-01-19 NOTE — Progress Notes
Attending Statement: Attending Addendum:I have seen the patient and  reviewed the notes was written  by the resident. I concur with her/his documentation of Brigitta Natanya Holecek. Genice Rouge, MD, MD7/17/2021PGY-3 CLINIC NOTE Patient Data:  Patient Name: Anne Yoder Age: 29 y.o. DOB: 02/19/1991	 MRN: [id number]	 PRESENTATION HISTORY: Anne Yoder is a 29 y.o. G0P0000 who attends clinic two weeks post-op s/p diagnostic laparoscopy, right salpingectomy, biopsy of endometriosis lesion, chromopertubation, Mirena IUD insertion for chronic pelvic pain and hydrosalpinx. Patient reports feeling very well post-operatively. Reports pain is minimal and she is no longer taking pain medication. Reports normal appetite and energy level and denies nausea, vomiting, fevers, chills. Patient states she had one episode of vaginal itching and she felt inside and felt the IUD strings. Patient also reports that she has had bothersome facial hair growth for a long time.PHYSICAL EXAM: Vitals:BP 124/69  - Pulse (!) 95  - Wt 59 kg  - Breastfeeding No  - BMI 20.36 kg/m? Physical ExamGeneral: comfortable, A&OFace: small area of hair growth underneath chinCardiac: RRRPulm: CTABAbdomen: soft, non-tender, +BSExtremities: nil edema, nil calf tendernessSpeculum exam: normal appearing external genitalia, vaginal mucosa pink and moist, nulliparous cervix with IUD strings visualized extending approximately 4cm distal to external cervical os. IUD strings trimmed per patient requestREVIEW OF LABS/DIAGNOSTICS: Pathology: FINAL DIAGNOSIS 1. ?RIGHT FALLOPIAN TUBE: ? ? ? ?- DILATED BENIGN FALLOPIAN TUBE 2. ?RIGHT ABDOMINAL SIDE WALL: ? ? ? ?- ENDOMETRIOSIS ASSESSMENT: Anne Yoder is a 30 y.o. G0P0000 who attends clinic for post-op follow up s/p diagnostic laparoscopy, right salpingectomy, chromopertubation, and Mirena insertion for chronic pelvic pain. Pathology results consistent with endometriosis. Patient is recovering well without acute concerns. IUD strings trimmed at this visitPLAN: ?	Follow up in 1 year for annual exam?	Will restart birth control patch for treatment of endometriosis?	Mirena IUD to remain in placePatient seen and discussed with Dr. Penelope Coop.Veleta Miners MD PGY37/12/20212:09 PM

## 2020-02-27 ENCOUNTER — Encounter: Admit: 2020-02-27 | Payer: PRIVATE HEALTH INSURANCE | Attending: Medical | Primary: Internal Medicine

## 2020-02-27 ENCOUNTER — Ambulatory Visit: Admit: 2020-02-27 | Payer: MEDICAID | Attending: Medical | Primary: Internal Medicine

## 2020-02-27 DIAGNOSIS — Z113 Encounter for screening for infections with a predominantly sexual mode of transmission: Secondary | ICD-10-CM

## 2020-02-27 DIAGNOSIS — U071 COVID-19: Secondary | ICD-10-CM

## 2020-02-27 DIAGNOSIS — M1990 Unspecified osteoarthritis, unspecified site: Secondary | ICD-10-CM

## 2020-02-27 DIAGNOSIS — N200 Calculus of kidney: Secondary | ICD-10-CM

## 2020-02-27 DIAGNOSIS — D649 Anemia, unspecified: Secondary | ICD-10-CM

## 2020-02-27 DIAGNOSIS — Z30432 Encounter for removal of intrauterine contraceptive device: Secondary | ICD-10-CM

## 2020-02-27 DIAGNOSIS — E282 Polycystic ovarian syndrome: Secondary | ICD-10-CM

## 2020-02-27 DIAGNOSIS — K219 Gastro-esophageal reflux disease without esophagitis: Secondary | ICD-10-CM

## 2020-02-27 DIAGNOSIS — Z9289 Personal history of other medical treatment: Secondary | ICD-10-CM

## 2020-02-27 DIAGNOSIS — A64 Unspecified sexually transmitted disease: Secondary | ICD-10-CM

## 2020-02-27 DIAGNOSIS — M778 Other enthesopathies, not elsewhere classified: Secondary | ICD-10-CM

## 2020-02-27 DIAGNOSIS — F909 Attention-deficit hyperactivity disorder, unspecified type: Secondary | ICD-10-CM

## 2020-02-27 LAB — ZZZTRICHOMONAS ANTIGEN     (BH): BKR TRICHOMONAS ANTIGEN: NEGATIVE

## 2020-02-27 NOTE — Progress Notes
IUD RemovalType of IUD:  MirenaDate of insertion:  6/25/21Reason for removal:  Device expelling from cervical osOther relevant history/information:  Pt is s/p diagnostic laparoscopy, right salpingectomy, biopsy of endometriosis lesion, chromopertubation, Mirena IUD insertion for chronic pelvic pain and hydrosalpinx. Surgery was done on 12/28/19. The pathology was consistent with endometriosis. Pt states she felt like her IUD was expelling yesterday as she had some cramping, bleeding and the strings suddenly became longer. States she is concerned because this is the second IUD she has expelled. She is also on the patch (along with the IUD) for her endometriosis. Pt is unsure if she will need another form of birth control today to take with the patch as the IUD is coming out. Pt was on the IUD for ?chronic pelvic pain. It is unclear to me if there is another indication for her to have the Mirena IUD.Pt states she has had trouble with all other forms of birth control in the past. States she bled with nexplanon to the point where she needed a blood transfusion. She also bled with depo provera. She doesn't remember to take OCPs. States she also expels the nuva ring.Pt interested in vaginal cultures for STI screening.Time Out Documentation?	Procedure Name: IUD removal?	Procedure Time: 2:00pm?	Team Members Present: Astrid Divine, PA-C?	Time Out initiated after patient positioned & prior to beginning of procedure: Yes?	Name of Patient  & MRUN # or DOB stated and matches ID band or previously confirmed med record number: Yes?	Proceduralist states or confirms procedure to be performed: Yes?	Procedural consent is used to verify procedure performed  & matches pt identifiers:Yes?	Site of procedure(s) (with laterality or level) is topically marked per policy & visible after draping:YesProcedure DetailsIUD strings visible:  Yes. Tip of IUD visualized expelling from cervical os on speculum exam. + mild dark red blood in vaginal vaultLocal anesthesia:  NoneTenaculum used:  NoRemoval:  IUD strings grasped and IUD removed intact with gentle traction.  The patient tolerated the procedure well. A a 3x1cm chunk of ?tissue/blood clot came out with the IUD. No extra bleeding noted following IUD removal and no pain per pt.All appropriate instructions regarding removal were reviewed.Plans for contraception:Pt is already on ortho evra for her endometriosisOther follow-up needed:  Pt to RTC with a resident on a day Dr. Penelope Coop is here to follow up pathology results/endometriosis. It is unclear if pt has an indication for the IUD to stay in place along with the patch. Otherwise, it seems her endometriosis can be treated with the patch only. I do not seen any evidence of endometrial hyperplasia in pt's chart (which would be indication for IUD). Discussed with Dr. Shela Commons. LaiferClinician Documentation:This procedure was performed by me Tissue specimen from IUD removal sent to lab for testing.Sherea Liptak Milano8/25/2021 3:56 PM

## 2020-02-29 LAB — CHLAMYDIA TRACHOMATIS, NAAT     (BH GH LMW YH): BKR CHLAMYDIA, DNA PROBE: NEGATIVE

## 2020-02-29 LAB — NEISSERIA GONORRHEA, NAAT     (BH GH L LMW YH): BKR NEISSERIA GONORRHOEAE, DNA PROBE: NEGATIVE

## 2020-03-03 NOTE — Other
No abnormal cells.

## 2020-03-03 NOTE — Other
F/u with Dr. Chryl Heck 9/9

## 2020-03-13 ENCOUNTER — Ambulatory Visit: Admit: 2020-03-13 | Payer: MEDICAID | Attending: Obstetrics and Gynecology | Primary: Internal Medicine

## 2020-03-13 ENCOUNTER — Encounter
Admit: 2020-03-13 | Payer: PRIVATE HEALTH INSURANCE | Attending: Student in an Organized Health Care Education/Training Program | Primary: Internal Medicine

## 2020-03-13 DIAGNOSIS — N200 Calculus of kidney: Secondary | ICD-10-CM

## 2020-03-13 DIAGNOSIS — D649 Anemia, unspecified: Secondary | ICD-10-CM

## 2020-03-13 DIAGNOSIS — F909 Attention-deficit hyperactivity disorder, unspecified type: Secondary | ICD-10-CM

## 2020-03-13 DIAGNOSIS — Z9289 Personal history of other medical treatment: Secondary | ICD-10-CM

## 2020-03-13 DIAGNOSIS — A64 Unspecified sexually transmitted disease: Secondary | ICD-10-CM

## 2020-03-13 DIAGNOSIS — E282 Polycystic ovarian syndrome: Secondary | ICD-10-CM

## 2020-03-13 DIAGNOSIS — M778 Other enthesopathies, not elsewhere classified: Secondary | ICD-10-CM

## 2020-03-13 DIAGNOSIS — M1990 Unspecified osteoarthritis, unspecified site: Secondary | ICD-10-CM

## 2020-03-13 DIAGNOSIS — U071 COVID-19: Secondary | ICD-10-CM

## 2020-03-13 DIAGNOSIS — K219 Gastro-esophageal reflux disease without esophagitis: Secondary | ICD-10-CM

## 2020-03-14 DIAGNOSIS — N809 Endometriosis, unspecified: Secondary | ICD-10-CM

## 2020-03-21 NOTE — Progress Notes
Attending Statement: Attending AddendumI have reviewed the notes, assessments, and/or procedures performed by the resident. I concur with her/his documentation of Anne Yoder. Chapman Fitch Kashani9/16/2021 8:09 PMCLINIC NOTE Patient Data:  Patient Name: Anne Yoder Age: 29 y.o. DOB: 03-Jul-1991	 MRN: [id number]	 PRESENTATION HISTORY: Anne Yoder is a 29 y.o. G0P0000 who attends clinic for follow up of pathology after IUD removal and endometriosis. Patient had pathology on a piece of tissue that was noted to be wither her IUD on her removal two weeks ago.  Regarding her endometriosis, the patient states she is doing very well on the patch and she doesn't currently have any endometriosis pain. She states she has some cramping with her period, but it is very manageable. PHYSICAL EXAM: Vitals:BP (!) 120/56  - Pulse 86  - Temp 97.5 ?F (36.4 ?C) (No-touch scanner)  - Resp 18  - Ht 5' 7 (1.702 m)  - Wt 59.9 kg  - BMI 20.67 kg/m? REVIEW OF LABS/DIAGNOSTICS: Pathology: ENDOMETRIUM CURRETTINGS AND IUD:? ? ?- ABUNDANT BLOOD AND INFLAMMATORY CELLS ? ? ?- NO EPITHELIUM IDENTIFIED ? ? ?- IUD, SEE GROSS DESCRIPTION FOR DETAILS ASSESSMENT: Anne Yoder is a 29 y.o. G0P0000 who attends clinic for follow up for tissue pathology after IUD removal, which was benign and endometriosis. Patient is doing well on the patch and does not currently have any pain. Will continue on the patch as long as patient remains asymptomaticPLAN: Endometriosis?	Currently asymptomatic?	Continue Xulane patch?	Return to clinic for annual examPatient discussed with Dr. Penelope Coop.Sheppard Evens, MDOB/GYN PGY2[facility]9/9/20213:31 PM

## 2020-04-26 ENCOUNTER — Telehealth
Admit: 2020-04-26 | Payer: PRIVATE HEALTH INSURANCE | Attending: Student in an Organized Health Care Education/Training Program | Primary: Internal Medicine

## 2020-04-27 NOTE — Telephone Encounter
Attempted to contact patient regarding call to the call center. Per call center, patient is concerned about her endometriosis pain. Left a message.Sheppard Evens, MDOB/GYN PGY2[facility]10/23/20219:17 PM

## 2020-07-08 ENCOUNTER — Inpatient Hospital Stay: Admit: 2020-07-08 | Discharge: 2020-07-08 | Payer: MEDICAID | Primary: Internal Medicine

## 2020-07-08 DIAGNOSIS — Z20828 Contact with and (suspected) exposure to other viral communicable diseases: Secondary | ICD-10-CM

## 2020-07-08 DIAGNOSIS — Z20822 Contact with and (suspected) exposure to covid-19: Secondary | ICD-10-CM

## 2020-07-09 LAB — SARS COV-2 (COVID-19) RNA-~~LOC~~ LABS (BH GH LMW YH): BKR SARS-COV-2 RNA (COVID-19) (YH): NEGATIVE

## 2020-07-20 ENCOUNTER — Inpatient Hospital Stay: Admit: 2020-07-20 | Discharge: 2020-07-20 | Payer: MEDICAID | Primary: Internal Medicine

## 2020-07-20 DIAGNOSIS — Z20828 Contact with and (suspected) exposure to other viral communicable diseases: Secondary | ICD-10-CM

## 2020-07-21 DIAGNOSIS — Z20822 Contact with and (suspected) exposure to covid-19: Secondary | ICD-10-CM

## 2020-07-21 LAB — SARS COV-2 (COVID-19) RNA-~~LOC~~ LABS (BH GH LMW YH): BKR SARS-COV-2 RNA (COVID-19) (YH): NOT DETECTED

## 2020-08-30 ENCOUNTER — Telehealth
Admit: 2020-08-30 | Payer: PRIVATE HEALTH INSURANCE | Attending: Student in an Organized Health Care Education/Training Program | Primary: Internal Medicine

## 2020-08-31 NOTE — Telephone Encounter
Called the patient who is c/o diarrhea around 3 episodes per day. Denies any fever, chills, blood in stool. Reports some abd pain. Recommended to stay hydrated, visit ED if develops worsening symptoms. Electronically Signed by Donalda Ewings, MD, August 30, 2020

## 2021-01-15 ENCOUNTER — Encounter
Admit: 2021-01-15 | Payer: PRIVATE HEALTH INSURANCE | Attending: Student in an Organized Health Care Education/Training Program | Primary: Internal Medicine

## 2021-01-15 MED ORDER — NORELGESTROMIN 150 MCG-E.ESTRADIOL 35 MCG/24 HR WEEKLY TRANSDERM PATCH
150-35 mcg/24 hr | MEDICATED_PATCH | TRANSDERMAL | 7 refills | Status: AC
Start: 2021-01-15 — End: ?

## 2021-01-29 ENCOUNTER — Ambulatory Visit: Payer: Self-pay

## 2021-02-17 ENCOUNTER — Telehealth: Payer: Self-pay | Admitting: Radiology

## 2021-02-17 NOTE — Telephone Encounter (Signed)
Left voicemail for patient explaining that I have cancelled the appointment that she scheduled through self scheduling with Dr Alvester Morin on 03/25/21. Patient scheduled for NEW GYN but placed in notes that she was scheduling for PCP.

## 2021-02-20 ENCOUNTER — Other Ambulatory Visit (HOSPITAL_BASED_OUTPATIENT_CLINIC_OR_DEPARTMENT_OTHER): Payer: Self-pay

## 2021-02-20 ENCOUNTER — Ambulatory Visit: Payer: Self-pay | Attending: Internal Medicine

## 2021-02-20 ENCOUNTER — Other Ambulatory Visit: Payer: Self-pay

## 2021-02-20 ENCOUNTER — Ambulatory Visit: Payer: Self-pay

## 2021-02-20 DIAGNOSIS — Z23 Encounter for immunization: Secondary | ICD-10-CM

## 2021-02-20 MED ORDER — PFIZER-BIONT COVID-19 VAC-TRIS 30 MCG/0.3ML IM SUSP
INTRAMUSCULAR | 0 refills | Status: DC
  Filled 2021-02-20: qty 0.3, 1d supply, fill #0

## 2021-02-20 NOTE — Progress Notes (Signed)
   Covid-19 Vaccination Clinic  Name:  Merelin Human    MRN: [id number] DOB: [birth year]  02/20/2021  Ms. Medico was observed post Covid-19 immunization for 15 minutes without incident. She was provided with Vaccine Information Sheet and instruction to access the V-Safe system.   Ms. Bryant was instructed to call 911 with any severe reactions post vaccine: Difficulty breathing  Swelling of face and throat  A fast heartbeat  A bad rash all over body  Dizziness and weakness   Immunizations Administered     Name Date Dose VIS Date Route   PFIZER Comrnaty(Gray TOP) Covid-19 Vaccine 02/20/2021 11:32 AM 0.3 mL 06/12/2020 Intramuscular   Manufacturer: [employer], [employer]   Lot: [id number]   NDC: [phone]

## 2021-03-03 ENCOUNTER — Other Ambulatory Visit: Payer: Self-pay

## 2021-03-03 ENCOUNTER — Ambulatory Visit: Payer: 59 | Admitting: Obstetrics & Gynecology

## 2021-03-03 ENCOUNTER — Other Ambulatory Visit (HOSPITAL_COMMUNITY): Payer: Self-pay

## 2021-03-03 ENCOUNTER — Encounter: Payer: Self-pay | Admitting: Obstetrics & Gynecology

## 2021-03-03 VITALS — BP 118/78 | HR 97 | Ht 67.0 in | Wt 122.6 lb

## 2021-03-03 DIAGNOSIS — N809 Endometriosis, unspecified: Secondary | ICD-10-CM

## 2021-03-03 DIAGNOSIS — E282 Polycystic ovarian syndrome: Secondary | ICD-10-CM | POA: Diagnosis not present

## 2021-03-03 MED ORDER — ZAFEMY 150-35 MCG/24HR TD PTWK
1.0000 | MEDICATED_PATCH | TRANSDERMAL | 5 refills | Status: DC
Start: 2021-03-03 — End: 2022-03-06
  Filled 2021-03-03: qty 3, 28d supply, fill #0
  Filled 2021-03-19: qty 12, 84d supply, fill #1
  Filled 2021-06-25: qty 12, 84d supply, fill #2
  Filled 2021-09-16 – 2021-09-28 (×2): qty 12, 84d supply, fill #3
  Filled 2021-10-01: qty 3, 21d supply, fill #3
  Filled 2021-11-12: qty 3, 21d supply, fill #4
  Filled 2021-12-06: qty 3, 21d supply, fill #5
  Filled 2021-12-24 – 2022-01-01 (×2): qty 3, 21d supply, fill #6
  Filled 2022-01-22: qty 3, 21d supply, fill #7
  Filled 2022-02-19: qty 3, 21d supply, fill #8

## 2021-03-03 NOTE — Progress Notes (Signed)
Patches for bc

## 2021-03-03 NOTE — Progress Notes (Signed)
   GYNECOLOGY OFFICE VISIT NOTE  History:   Sherri Welch is a 30 y.o. G0P0000 here today to establish care, just moved from [place].  She has a history of laparoscopy and biopsy proven endometriosis and PCOS, also s/p right salpingectomy for hydrosalpinx in 12/2019.  She has been on different hormonal regimens in the past, currently on Xulane and she is doing well on this.  Has bleeding and pain during her placebo weeks, wants to take this continuously. She denies any current abnormal vaginal discharge, bleeding, pelvic pain or other concerns.    Past Medical History:  Diagnosis Date   ADHD (attention deficit hyperactivity disorder)    Arthritis    knees and back   Endometriosis determined by laparoscopy 12/28/2019   Heart murmur    PCOS (polycystic ovarian syndrome)     Past Surgical History:  Procedure Laterality Date   LAPAROSCOPIC UNILATERAL SALPINGECTOMY Right 12/28/2019   Removal of Hydrosalpinx, diagnosed with endometriosis (pathology). Done at [facility].    The following portions of the patient's history were reviewed and updated as appropriate: allergies, current medications, past family history, past medical history, past social history, past surgical history and problem list.   Health Maintenance:  Normal pap on 10/15/2019.     Review of Systems:  Pertinent items noted in HPI and remainder of comprehensive ROS otherwise negative.  Physical Exam:  BP 118/78   Pulse 97   Ht 5\' 7"  (1.702 m)   Wt 122 lb 9.6 oz (55.6 kg)   BMI 19.20 kg/m  CONSTITUTIONAL: Well-developed, well-nourished female in no acute distress.  HEENT:  Normocephalic, atraumatic. External right and left ear normal. No scleral icterus.  NECK: Normal range of motion, supple, no masses noted on observation SKIN: No rash noted. Not diaphoretic. No erythema. No pallor. MUSCULOSKELETAL: Normal range of motion. No edema noted. NEUROLOGIC: Alert and oriented to person, place, and time. Normal muscle tone coordination.  No cranial nerve deficit noted. PSYCHIATRIC: Normal mood and affect. Normal behavior. Normal judgment and thought content. CARDIOVASCULAR: Normal heart rate noted RESPIRATORY: Effort and breath sounds normal, no problems with respiration noted ABDOMEN: No masses noted. No other overt distention noted.   PELVIC: Deferred     Assessment and Plan:     1. Endometriosis 2. PCOS (polycystic ovarian syndrome) Will continue Xulane for now, prescribed in a continuous fashion to help with bleeding and pain during her placebo week. Patient will contact for any concerning symptoms - norelgestromin-ethinyl estradiol [place]) 150-35 MCG/24HR transdermal patch; Place 1 patch onto the skin once a week. Use continuously.  Dispense: 12 patch; Refill: 5 Routine preventative health maintenance measures emphasized.  Return for any gynecologic concerns.    I spent 20 minutes dedicated to the care of this patient including pre-visit review of records, face to face time with the patient discussing her conditions and treatments and post visit orders.    Burr Medico, MD, FACOG Obstetrician & Gynecologist, [facility] for [facility]

## 2021-03-05 ENCOUNTER — Other Ambulatory Visit (HOSPITAL_COMMUNITY): Payer: Self-pay

## 2021-03-10 ENCOUNTER — Other Ambulatory Visit (HOSPITAL_COMMUNITY): Payer: Self-pay

## 2021-03-10 ENCOUNTER — Encounter: Payer: Self-pay | Admitting: *Deleted

## 2021-03-16 ENCOUNTER — Other Ambulatory Visit (HOSPITAL_COMMUNITY): Payer: Self-pay

## 2021-03-17 ENCOUNTER — Ambulatory Visit (HOSPITAL_BASED_OUTPATIENT_CLINIC_OR_DEPARTMENT_OTHER): Payer: 59 | Admitting: Family Medicine

## 2021-03-17 ENCOUNTER — Encounter (HOSPITAL_BASED_OUTPATIENT_CLINIC_OR_DEPARTMENT_OTHER): Payer: Self-pay | Admitting: Family Medicine

## 2021-03-17 ENCOUNTER — Other Ambulatory Visit: Payer: Self-pay

## 2021-03-17 VITALS — BP 116/74 | HR 84 | Ht 67.0 in | Wt 123.6 lb

## 2021-03-17 DIAGNOSIS — G8929 Other chronic pain: Secondary | ICD-10-CM

## 2021-03-17 DIAGNOSIS — Z600 Problems of adjustment to life-cycle transitions: Secondary | ICD-10-CM | POA: Diagnosis not present

## 2021-03-17 DIAGNOSIS — M25511 Pain in right shoulder: Secondary | ICD-10-CM

## 2021-03-17 DIAGNOSIS — R5383 Other fatigue: Secondary | ICD-10-CM | POA: Diagnosis not present

## 2021-03-17 LAB — CBC WITH DIFFERENTIAL/PLATELET
Basophils Absolute: 0 10*3/uL (ref 0.0–0.2)
Basos: 0 %
EOS (ABSOLUTE): 0.1 10*3/uL (ref 0.0–0.4)
Eos: 1 %
Hematocrit: 30.5 % — ABNORMAL LOW (ref 34.0–46.6)
Hemoglobin: 9.2 g/dL — ABNORMAL LOW (ref 11.1–15.9)
Immature Grans (Abs): 0 10*3/uL (ref 0.0–0.1)
Immature Granulocytes: 0 %
Lymphocytes Absolute: 1.7 10*3/uL (ref 0.7–3.1)
Lymphs: 29 %
MCH: 24.7 pg — ABNORMAL LOW (ref 26.6–33.0)
MCHC: 30.2 g/dL — ABNORMAL LOW (ref 31.5–35.7)
MCV: 82 fL (ref 79–97)
Monocytes Absolute: 0.6 10*3/uL (ref 0.1–0.9)
Monocytes: 11 %
Neutrophils Absolute: 3.5 10*3/uL (ref 1.4–7.0)
Neutrophils: 59 %
Platelets: 271 10*3/uL (ref 150–450)
RBC: 3.72 x10E6/uL — ABNORMAL LOW (ref 3.77–5.28)
RDW: 15.2 % (ref 11.7–15.4)
WBC: 6 10*3/uL (ref 3.4–10.8)

## 2021-03-17 LAB — BASIC METABOLIC PANEL
BUN/Creatinine Ratio: 21 (ref 9–23)
BUN: 12 mg/dL (ref 6–20)
CO2: 23 mmol/L (ref 20–29)
Calcium: 9.4 mg/dL (ref 8.7–10.2)
Chloride: 105 mmol/L (ref 96–106)
Creatinine, Ser: 0.57 mg/dL (ref 0.57–1.00)
Glucose: 75 mg/dL (ref 65–99)
Potassium: 4.9 mmol/L (ref 3.5–5.2)
Sodium: 141 mmol/L (ref 134–144)
eGFR: 127 mL/min/{1.73_m2} (ref >59)

## 2021-03-17 NOTE — Patient Instructions (Signed)
  Medication Instructions:  Your physician recommends that you continue on your current medications as directed. Please refer to the Current Medication list given to you today. --If you need a refill on any your medications before your next appointment, please call your pharmacy first. If no refills are authorized on file call the office.--  Lab Work: Your physician has recommended that you have lab work today: CBC and BMET If you have labs (blood work) drawn today and your tests are completely normal, you will receive your results via MyChart message OR a phone call from our staff.  Please ensure you check your voicemail in the event that you authorized detailed messages to be left on a delegated number. If you have any lab test that is abnormal or we need to change your treatment, we will call you to review the results.  Follow-Up: Your next appointment:   Your physician recommends that you schedule a follow-up appointment in: 2-3 MONTHS with Dr. de [place]  Thanks for letting us be apart of your health journey!!  Primary Care and Sports Medicine   Dr. Ceasar Mons [place]   We encourage you to activate your patient portal called "MyChart".  Sign up information is provided on this After Visit Summary.  MyChart is used to connect with patients for Virtual Visits (Telemedicine).  Patients are able to view lab/test results, encounter notes, upcoming appointments, etc.  Non-urgent messages can be sent to your provider as well. To learn more about what you can do with MyChart, please visit --  [email or web address].

## 2021-03-18 NOTE — Progress Notes (Signed)
New Patient Office Visit  Subjective:  Patient ID: Sherri Welch, female    DOB: [birth year]  Age: 30 y.o. MRN: [id number]  CC:  Chief Complaint  Patient presents with   Establish Care    Previous PCP in [place] - records should be in care everywhere. OB - Sherri Welch - Records in [place]. No specific concerns or complaints today.    Right shoulder pain    Patient has a hx of tendonitis is right shoulder. She has done PT in the past and has exercises she supposed to do at home. She thinks in the future she may need an Ortho referral but not needing it currently    HPI Sherri Welch is a 30 year old female presenting to establish in clinic.  She has current concerns as outlined above.  Also reports recent fatigue.  Reports past medical history of PCOS, endometriosis, GERD.  Fatigue: She works that she has had some recent fatigue.  Feels that this has led to her being less interested in doing things outside of work.  For example, she indicates that her sister will ask about going on/hang out and patient is disinterested.  She relates this to not getting much sleep as she is working 2 jobs currently.  She is working 2 jobs as she expresses that she is working towards certain financial goals that she would like to be able to buy a house in the near future.  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  On the days where she works both jobs, she is working approximately from 8 AM until 10:30 PM.  As a result, she reports that she will sleep about 5 to 7 hours at night.  Right shoulder pain: Has been a chronic intermittent issue for her in the past.  Previously she has seen orthopedic surgeon and was recommended treatment with physical therapy.  She has completed PT in the past and found this to be helpful.  She was provided with home exercises to do, but indicates that she has not been as good about doing them at home.  Past Medical  History:  Diagnosis Date   ADHD (attention deficit hyperactivity disorder)    Allergy    Anemia    Arthritis    knees and back   Blood transfusion without reported diagnosis    Complex regional pain syndrome type 1 affecting right upper arm 07/15/2017   Endometriosis determined by laparoscopy 12/28/2019   GERD (gastroesophageal reflux disease)    Heart murmur    PCOS (polycystic ovarian syndrome)     Past Surgical History:  Procedure Laterality Date   LAPAROSCOPIC UNILATERAL SALPINGECTOMY Right 12/28/2019   Removal of Hydrosalpinx, diagnosed with endometriosis (pathology). Done at [facility].    Family History  Problem Relation Age of Onset   Asthma Mother    Drug abuse Mother    Miscarriages / [place] Mother    Allergies Brother    Asthma Brother    Learning disabilities Brother    Asthma Brother    ADD / ADHD Brother    Heart disease Maternal Grandmother    Miscarriages / Stillbirths Maternal Grandmother    Stroke Paternal Grandmother     Social History   Socioeconomic History   Marital status: Single    Spouse name: Not on file   Number of children: 0   Years of education: Not on file   Highest education level: Not on file  Occupational History   Occupation: Consulting civil engineer  Tobacco Use   Smoking status: Never   Smokeless tobacco: Never  Substance and Sexual Activity   Alcohol use: No   Drug use: No   Sexual activity: Not Currently    Birth control/protection: Patch  Other Topics Concern   Not on file  Social History Narrative   Not on file   Social Determinants of Health   Financial Resource Strain: Not on file  Food Insecurity: Not on file  Transportation Needs: Not on file  Physical Activity: Not on file  Stress: Not on file  Social Connections: Not on file  Intimate Partner Violence: Not on file    Objective:   Today's Vitals: BP 116/74   Pulse 84   Ht 5\' 7"  (1.702 m)   Wt 123 lb 9.6 oz (56.1 kg)   LMP 03/05/2021   SpO2 99%   BMI 19.36 kg/m    Physical Exam  Pleasant 30 year old female in no acute distress Cardiovascular exam with regular rate and rhythm, no murmurs appreciated Lungs clear to auscultation bilaterally Right shoulder: Obvious swelling, bruising or erythema: absent Deformity of the shoulder: absent Active ROM: full range of motion Passive ROM: full range of motion Strength: normal/normal Empty can: Positive Hawkins: Positive Neer's: Positive Neurovascular exam: intact  Assessment & Plan:   Problem List Items Addressed This Visit       Other   Right shoulder pain - Primary    Suspect related to impingement syndrome, discussed treatment recommendations with patient Recommend following through with home exercise program, patient is aware and indicates that she will Can also consider reevaluation with physical therapy, patient wishes to hold off for now, will let 26 know if she would like to proceed with this Otherwise, can continue with conservative measures including OTC medications, ice, heat as desired      Relevant Orders   Basic metabolic panel (Completed)   CBC with Differential/Platelet (Completed)   Fatigue    Likely related to lack of sleep as discussed above Does have history of anemia Will check labs as below      Relevant Orders   Basic metabolic panel (Completed)   CBC with Differential/Platelet (Completed)   Phase of life problem    Patient was a bit tearful discussing current situation as she lives with her family and her sister will often ask her to to go out and patient is disinterested in this given working 2 jobs and having a corresponding lack of sleep Feel that a moderate amount of her symptoms are related to her work schedule and related lack of sleep Did discuss option of meeting with psychologist through our office, patient declines at this time but will consider       Outpatient Encounter Medications as of 03/17/2021  Medication Sig   norelgestromin-ethinyl estradiol  (ZAFEMY) 150-35 MCG/24HR transdermal patch Place 1 patch onto the skin once a week.   No facility-administered encounter medications on file as of 03/17/2021.   Spent 45 minutes on this patient encounter, including preparation, chart review, face-to-face counseling with patient and coordination of care, and documentation of encounter  Follow-up: Return in about 3 months (around 06/16/2021).  Plan for follow-up in about 3 months or sooner as needed  Dewel Lotter J De 06/18/2021, MD

## 2021-03-19 ENCOUNTER — Other Ambulatory Visit (HOSPITAL_COMMUNITY): Payer: Self-pay

## 2021-03-19 ENCOUNTER — Encounter (HOSPITAL_BASED_OUTPATIENT_CLINIC_OR_DEPARTMENT_OTHER): Payer: Self-pay | Admitting: Family Medicine

## 2021-03-19 ENCOUNTER — Other Ambulatory Visit (HOSPITAL_BASED_OUTPATIENT_CLINIC_OR_DEPARTMENT_OTHER): Payer: Self-pay

## 2021-03-19 DIAGNOSIS — R5383 Other fatigue: Secondary | ICD-10-CM

## 2021-03-19 NOTE — Assessment & Plan Note (Signed)
Patient was a bit tearful discussing current situation as she lives with her family and her sister will often ask her to to go out and patient is disinterested in this given working 2 jobs and having a corresponding lack of sleep Feel that a moderate amount of her symptoms are related to her work schedule and related lack of sleep Did discuss option of meeting with psychologist through our office, patient declines at this time but will consider

## 2021-03-19 NOTE — Assessment & Plan Note (Signed)
Suspect related to impingement syndrome, discussed treatment recommendations with patient Recommend following through with home exercise program, patient is aware and indicates that she will Can also consider reevaluation with physical therapy, patient wishes to hold off for now, will let us know if she would like to proceed with this Otherwise, can continue with conservative measures including OTC medications, ice, heat as desired

## 2021-03-19 NOTE — Assessment & Plan Note (Signed)
Likely related to lack of sleep as discussed above Does have history of anemia Will check labs as below

## 2021-03-19 NOTE — Progress Notes (Signed)
Per Dr. de Peru additional labs need to be ordered. " From recent lab results, will need to check iron studies, TSH with reflex, vitamin B12, folate.  Will need to check if these can be added on or if new blood draw is needed."  Will place orders and fax to [facility]

## 2021-03-25 ENCOUNTER — Other Ambulatory Visit (HOSPITAL_COMMUNITY): Payer: Self-pay

## 2021-03-25 ENCOUNTER — Encounter: Payer: Self-pay | Admitting: Family Medicine

## 2021-03-26 LAB — IRON AND TIBC
Iron Saturation: 3 % — CL (ref 15–55)
Iron: 14 ug/dL — ABNORMAL LOW (ref 27–159)
Total Iron Binding Capacity: 410 ug/dL (ref 250–450)
UIBC: 396 ug/dL (ref 131–425)

## 2021-03-26 LAB — TSH RFX ON ABNORMAL TO FREE T4: TSH: 2.57 u[IU]/mL (ref 0.450–4.500)

## 2021-03-26 LAB — VITAMIN B12: Vitamin B-12: 345 pg/mL (ref 232–1245)

## 2021-03-26 LAB — FOLATE: Folate: 6.3 ng/mL (ref >3.0)

## 2021-03-26 LAB — SPECIMEN STATUS REPORT

## 2021-03-26 LAB — FERRITIN: Ferritin: 3 ng/mL — ABNORMAL LOW (ref 15–150)

## 2021-03-31 ENCOUNTER — Ambulatory Visit (HOSPITAL_BASED_OUTPATIENT_CLINIC_OR_DEPARTMENT_OTHER): Payer: Self-pay | Admitting: Family Medicine

## 2021-06-15 ENCOUNTER — Other Ambulatory Visit: Payer: Self-pay

## 2021-06-15 ENCOUNTER — Ambulatory Visit (HOSPITAL_BASED_OUTPATIENT_CLINIC_OR_DEPARTMENT_OTHER): Payer: 59 | Admitting: Family Medicine

## 2021-06-15 ENCOUNTER — Encounter (HOSPITAL_BASED_OUTPATIENT_CLINIC_OR_DEPARTMENT_OTHER): Payer: Self-pay | Admitting: Family Medicine

## 2021-06-15 VITALS — BP 120/74 | HR 81 | Ht 67.0 in | Wt 125.1 lb

## 2021-06-15 DIAGNOSIS — D508 Other iron deficiency anemias: Secondary | ICD-10-CM | POA: Diagnosis not present

## 2021-06-15 NOTE — Patient Instructions (Signed)
  Medication Instructions:  Your physician recommends that you continue on your current medications as directed. Please refer to the Current Medication list given to you today. --If you need a refill on any your medications before your next appointment, please call your pharmacy first. If no refills are authorized on file call the office.-- Lab Work: Your physician has recommended that you have lab work today: CBC, Ferritin, Iron and TIBC If you have labs (blood work) drawn today and your tests are completely normal, you will receive your results via MyChart message OR a phone call from our staff.  Please ensure you check your voicemail in the event that you authorized detailed messages to be left on a delegated number. If you have any lab test that is abnormal or we need to change your treatment, we will call you to review the results.  Follow-Up: Your next appointment:   Your physician recommends that you schedule a follow-up appointment in: 3 MONTHS with Dr. de [place]  You will receive a text message or e-mail with a link to a survey about your care and experience with [place] today! We would greatly appreciate your feedback!   Thanks for letting us be apart of your health journey!!  Primary Care and Sports Medicine   Dr. Ceasar Mons [place]   We encourage you to activate your patient portal called "MyChart".  Sign up information is provided on this After Visit Summary.  MyChart is used to connect with patients for Virtual Visits (Telemedicine).  Patients are able to view lab/test results, encounter notes, upcoming appointments, etc.  Non-urgent messages can be sent to your provider as well. To learn more about what you can do with MyChart, please visit --  [email or web address].

## 2021-06-15 NOTE — Progress Notes (Signed)
    Procedures performed today:    None.  Independent interpretation of notes and tests performed by another provider:   None.  Brief History, Exam, Impression, and Recommendations:    BP 120/74   Pulse 81   Ht 5\' 7"  (1.702 m)   Wt 125 lb 1.6 oz (56.7 kg)   LMP 04/12/2021 (Approximate)   SpO2 98%   BMI 19.59 kg/m   Anemia Labs completed at time of last office visit showed iron deficiency anemia with low hemoglobin and low RBC, iron, iron saturation and ferritin were all found to be low.  Since then she has started oral iron supplementation.  She was taking 3 days a week, however had notable constipation with this and discontinued use after about 3 weeks of therapy. She does report that at time of last labs, she was menstruating, does endorse heavy menstrual cycles.  Reports that has been about 2 months since her last cycle. Today, she continues to have some increased fatigue.  She has stopped working her part-time job, however it is working third shift now for her current position. Will check labs today including CBC, iron panel Discussed trying to proceed with oral iron supplementation 2 days/week if possible.  Discussed measures to mitigate constipation. Plan to follow-up in about 3 months to monitor progress If able to tolerate oral iron supplementation, continue with this to replete iron stores If continuing to have trouble with oral iron supplementation and depending on degree of residual anemia, may need to consider IV iron supplementation.   ___________________________________________ Sherri Welch de 06/12/2021, MD, ABFM, CAQSM Primary Care and Sports Medicine [facility]

## 2021-06-15 NOTE — Assessment & Plan Note (Signed)
Labs completed at time of last office visit showed iron deficiency anemia with low hemoglobin and low RBC, iron, iron saturation and ferritin were all found to be low.  Since then she has started oral iron supplementation.  She was taking 3 days a week, however had notable constipation with this and discontinued use after about 3 weeks of therapy. She does report that at time of last labs, she was menstruating, does endorse heavy menstrual cycles.  Reports that has been about 2 months since her last cycle. Today, she continues to have some increased fatigue.  She has stopped working her part-time job, however it is working third shift now for her current position. Will check labs today including CBC, iron panel Discussed trying to proceed with oral iron supplementation 2 days/week if possible.  Discussed measures to mitigate constipation. Plan to follow-up in about 3 months to monitor progress If able to tolerate oral iron supplementation, continue with this to replete iron stores If continuing to have trouble with oral iron supplementation and depending on degree of residual anemia, may need to consider IV iron supplementation.

## 2021-06-16 ENCOUNTER — Ambulatory Visit (HOSPITAL_BASED_OUTPATIENT_CLINIC_OR_DEPARTMENT_OTHER): Payer: 59 | Admitting: Family Medicine

## 2021-06-16 LAB — FERRITIN: Ferritin: 5 ng/mL — ABNORMAL LOW (ref 15–150)

## 2021-06-16 LAB — CBC WITH DIFFERENTIAL/PLATELET
Basophils Absolute: 0 10*3/uL (ref 0.0–0.2)
Basos: 0 %
EOS (ABSOLUTE): 0.1 10*3/uL (ref 0.0–0.4)
Eos: 1 %
Hematocrit: 32.5 % — ABNORMAL LOW (ref 34.0–46.6)
Hemoglobin: 10.2 g/dL — ABNORMAL LOW (ref 11.1–15.9)
Immature Grans (Abs): 0 10*3/uL (ref 0.0–0.1)
Immature Granulocytes: 0 %
Lymphocytes Absolute: 1.9 10*3/uL (ref 0.7–3.1)
Lymphs: 32 %
MCH: 26.2 pg — ABNORMAL LOW (ref 26.6–33.0)
MCHC: 31.4 g/dL — ABNORMAL LOW (ref 31.5–35.7)
MCV: 83 fL (ref 79–97)
Monocytes Absolute: 0.6 10*3/uL (ref 0.1–0.9)
Monocytes: 9 %
Neutrophils Absolute: 3.4 10*3/uL (ref 1.4–7.0)
Neutrophils: 58 %
Platelets: 255 10*3/uL (ref 150–450)
RBC: 3.9 x10E6/uL (ref 3.77–5.28)
RDW: 16.8 % — ABNORMAL HIGH (ref 11.7–15.4)
WBC: 5.9 10*3/uL (ref 3.4–10.8)

## 2021-06-16 LAB — IRON AND TIBC
Iron Saturation: 5 % — CL (ref 15–55)
Iron: 22 ug/dL — ABNORMAL LOW (ref 27–159)
Total Iron Binding Capacity: 424 ug/dL (ref 250–450)
UIBC: 402 ug/dL (ref 131–425)

## 2021-06-17 ENCOUNTER — Encounter (HOSPITAL_BASED_OUTPATIENT_CLINIC_OR_DEPARTMENT_OTHER): Payer: Self-pay | Admitting: Family Medicine

## 2021-06-17 DIAGNOSIS — D508 Other iron deficiency anemias: Secondary | ICD-10-CM

## 2021-06-17 DIAGNOSIS — R5383 Other fatigue: Secondary | ICD-10-CM

## 2021-06-22 LAB — SPECIMEN STATUS REPORT

## 2021-06-22 LAB — HEMOGLOBIN A1C
Est. average glucose Bld gHb Est-mCnc: 108 mg/dL
Hgb A1c MFr Bld: 5.4 % (ref 4.8–5.6)

## 2021-06-25 ENCOUNTER — Other Ambulatory Visit (HOSPITAL_COMMUNITY): Payer: Self-pay

## 2021-09-14 ENCOUNTER — Ambulatory Visit (HOSPITAL_BASED_OUTPATIENT_CLINIC_OR_DEPARTMENT_OTHER): Payer: 59 | Admitting: Family Medicine

## 2021-09-16 ENCOUNTER — Other Ambulatory Visit (HOSPITAL_BASED_OUTPATIENT_CLINIC_OR_DEPARTMENT_OTHER): Payer: Self-pay

## 2021-09-16 ENCOUNTER — Encounter: Payer: Self-pay | Admitting: Obstetrics & Gynecology

## 2021-09-17 ENCOUNTER — Other Ambulatory Visit (HOSPITAL_BASED_OUTPATIENT_CLINIC_OR_DEPARTMENT_OTHER): Payer: Self-pay

## 2021-09-18 ENCOUNTER — Other Ambulatory Visit (HOSPITAL_BASED_OUTPATIENT_CLINIC_OR_DEPARTMENT_OTHER): Payer: Self-pay

## 2021-09-21 ENCOUNTER — Other Ambulatory Visit (HOSPITAL_BASED_OUTPATIENT_CLINIC_OR_DEPARTMENT_OTHER): Payer: Self-pay

## 2021-09-28 ENCOUNTER — Other Ambulatory Visit (HOSPITAL_BASED_OUTPATIENT_CLINIC_OR_DEPARTMENT_OTHER): Payer: Self-pay

## 2021-09-29 ENCOUNTER — Other Ambulatory Visit (HOSPITAL_BASED_OUTPATIENT_CLINIC_OR_DEPARTMENT_OTHER): Payer: Self-pay

## 2021-09-30 ENCOUNTER — Other Ambulatory Visit (HOSPITAL_BASED_OUTPATIENT_CLINIC_OR_DEPARTMENT_OTHER): Payer: Self-pay

## 2021-10-01 ENCOUNTER — Other Ambulatory Visit (HOSPITAL_BASED_OUTPATIENT_CLINIC_OR_DEPARTMENT_OTHER): Payer: Self-pay

## 2021-10-02 ENCOUNTER — Other Ambulatory Visit (HOSPITAL_BASED_OUTPATIENT_CLINIC_OR_DEPARTMENT_OTHER): Payer: Self-pay

## 2021-10-06 ENCOUNTER — Encounter: Payer: Self-pay | Admitting: *Deleted

## 2021-10-21 ENCOUNTER — Ambulatory Visit (HOSPITAL_BASED_OUTPATIENT_CLINIC_OR_DEPARTMENT_OTHER): Payer: Managed Care, Other (non HMO) | Admitting: Family Medicine

## 2021-11-14 ENCOUNTER — Other Ambulatory Visit (HOSPITAL_COMMUNITY): Payer: Self-pay

## 2021-12-07 ENCOUNTER — Other Ambulatory Visit (HOSPITAL_BASED_OUTPATIENT_CLINIC_OR_DEPARTMENT_OTHER): Payer: Self-pay

## 2021-12-24 ENCOUNTER — Other Ambulatory Visit (HOSPITAL_BASED_OUTPATIENT_CLINIC_OR_DEPARTMENT_OTHER): Payer: Self-pay

## 2022-01-01 ENCOUNTER — Other Ambulatory Visit (HOSPITAL_BASED_OUTPATIENT_CLINIC_OR_DEPARTMENT_OTHER): Payer: Self-pay

## 2022-01-02 ENCOUNTER — Other Ambulatory Visit (HOSPITAL_COMMUNITY): Payer: Self-pay

## 2022-01-22 ENCOUNTER — Other Ambulatory Visit (HOSPITAL_COMMUNITY): Payer: Self-pay

## 2022-01-23 ENCOUNTER — Other Ambulatory Visit (HOSPITAL_COMMUNITY): Payer: Self-pay

## 2022-02-19 ENCOUNTER — Other Ambulatory Visit (HOSPITAL_BASED_OUTPATIENT_CLINIC_OR_DEPARTMENT_OTHER): Payer: Self-pay

## 2022-03-01 ENCOUNTER — Other Ambulatory Visit (HOSPITAL_BASED_OUTPATIENT_CLINIC_OR_DEPARTMENT_OTHER): Payer: Self-pay

## 2022-03-06 ENCOUNTER — Other Ambulatory Visit: Payer: Self-pay | Admitting: Obstetrics & Gynecology

## 2022-03-06 DIAGNOSIS — N809 Endometriosis, unspecified: Secondary | ICD-10-CM

## 2022-03-06 DIAGNOSIS — E282 Polycystic ovarian syndrome: Secondary | ICD-10-CM

## 2022-03-09 ENCOUNTER — Other Ambulatory Visit (HOSPITAL_BASED_OUTPATIENT_CLINIC_OR_DEPARTMENT_OTHER): Payer: Self-pay

## 2022-03-09 MED ORDER — ZAFEMY 150-35 MCG/24HR TD PTWK
1.0000 | MEDICATED_PATCH | TRANSDERMAL | 5 refills | Status: DC
  Filled 2022-03-09: qty 12, 84d supply, fill #0
  Filled 2022-05-28: qty 12, 84d supply, fill #1
  Filled 2022-09-05: qty 12, 84d supply, fill #2
  Filled 2022-09-18: qty 3, 21d supply, fill #2
  Filled 2022-09-21: qty 9, 63d supply, fill #2
  Filled 2022-12-11 – 2023-01-11 (×2): qty 12, 84d supply, fill #3

## 2022-04-13 ENCOUNTER — Ambulatory Visit (HOSPITAL_BASED_OUTPATIENT_CLINIC_OR_DEPARTMENT_OTHER): Payer: Managed Care, Other (non HMO) | Admitting: Family Medicine

## 2022-04-15 ENCOUNTER — Other Ambulatory Visit (HOSPITAL_BASED_OUTPATIENT_CLINIC_OR_DEPARTMENT_OTHER): Payer: Self-pay

## 2022-04-15 ENCOUNTER — Ambulatory Visit (HOSPITAL_BASED_OUTPATIENT_CLINIC_OR_DEPARTMENT_OTHER): Payer: Managed Care, Other (non HMO) | Admitting: Family Medicine

## 2022-04-15 ENCOUNTER — Encounter (HOSPITAL_BASED_OUTPATIENT_CLINIC_OR_DEPARTMENT_OTHER): Payer: Self-pay | Admitting: Family Medicine

## 2022-04-15 DIAGNOSIS — F32A Depression, unspecified: Secondary | ICD-10-CM

## 2022-04-15 HISTORY — DX: Depression, unspecified: F32.A

## 2022-04-15 MED ORDER — ESCITALOPRAM OXALATE 5 MG PO TABS
5.0000 mg | ORAL_TABLET | Freq: Every day | ORAL | 1 refills | Status: DC
Start: 2022-04-15 — End: 2023-07-26
  Filled 2022-04-15: qty 30, 30d supply, fill #0
  Filled 2022-10-28: qty 30, 30d supply, fill #1

## 2022-04-15 NOTE — Assessment & Plan Note (Signed)
Patient reports that she has been having increased issues with life stressors and feelings of depression.  She reports that she does have a counselor/therapist that she works with regularly.  She indicates that typically she is able to control mood symptoms with use of counseling/therapy, however she has had a more difficult time with symptoms in recent months.  In particular, she relates history of being adopted and that her birth mother passed away when she was 42 and generally has been able to cope well.  However, with getting older and recently turning very, she has had increased guilt.  She feels that part of this may have also been brought on by the passing of a mother figure to her.  She reports that a mom of close friends of hers who had been there for her when she was younger recently passed away and she wonders if this may have precipitated some of her current symptoms.  She is tearful during discussion today.  She does have concerns regarding starting on medication as she does not necessarily want to be on medications long-term.  She is also concerned about potential side effects related to medications.  She does indicate transient thoughts of harming herself, primarily when she is driving.  She denies having specific suicidal plan.  Denies any homicidal ideation. Today we discussed treatment options as well as potential risks, benefits, side effects related to different medications and medication classes. After discussion, patient elects to proceed with initiation of pharmacotherapy.  We will begin treatment with SSRI.  Additional consideration if she does not do well with SSRI is initiation of Wellbutrin given history of ADHD Recommend that she also continue with current counseling/therapy as this is typically beneficial in controlling symptoms, particularly in conjunction with medication

## 2022-04-15 NOTE — Progress Notes (Signed)
    Procedures performed today:    None.  Independent interpretation of notes and tests performed by another provider:   None.  Brief History, Exam, Impression, and Recommendations:    BP 122/85   Pulse 88   Temp 97.6 F (36.4 C) (Oral)   Ht 5\' 7"  (1.702 m)   Wt 125 lb (56.7 kg)   SpO2 100%   BMI 19.58 kg/m   Depression Patient reports that she has been having increased issues with life stressors and feelings of depression.  She reports that she does have a counselor/therapist that she works with regularly.  She indicates that typically she is able to control mood symptoms with use of counseling/therapy, however she has had a more difficult time with symptoms in recent months.  In particular, she relates history of being adopted and that her birth mother passed away when she was 19 and generally has been able to cope well.  However, with getting older and recently turning very, she has had increased guilt.  She feels that part of this may have also been brought on by the passing of a mother figure to her.  She reports that a mom of close friends of hers who had been there for her when she was younger recently passed away and she wonders if this may have precipitated some of her current symptoms.  She is tearful during discussion today.  She does have concerns regarding starting on medication as she does not necessarily want to be on medications long-term.  She is also concerned about potential side effects related to medications.  She does indicate transient thoughts of harming herself, primarily when she is driving.  She denies having specific suicidal plan.  Denies any homicidal ideation. Today we discussed treatment options as well as potential risks, benefits, side effects related to different medications and medication classes. After discussion, patient elects to proceed with initiation of pharmacotherapy.  We will begin treatment with SSRI.  Additional consideration if she does not do  well with SSRI is initiation of Wellbutrin given history of ADHD Recommend that she also continue with current counseling/therapy as this is typically beneficial in controlling symptoms, particularly in conjunction with medication  Return in about 2 weeks (around 04/29/2022) for med check, can be virtual.   ___________________________________________ Dravyn Severs de [place], MD, ABFM, CAQSM Primary Care and [place]

## 2022-04-15 NOTE — Patient Instructions (Signed)
  Medication Instructions:  Your physician recommends that you continue on your current medications as directed. Please refer to the Current Medication list given to you today. --If you need a refill on any your medications before your next appointment, please call your pharmacy first. If no refills are authorized on file call the office.-- Lab Work: Your physician has recommended that you have lab work today: No If you have labs (blood work) drawn today and your tests are completely normal, you will receive your results via [place] a phone call from our staff.  Please ensure you check your voicemail in the event that you authorized detailed messages to be left on a delegated number. If you have any lab test that is abnormal or we need to change your treatment, we will call you to review the results.  Referrals/Procedures/Imaging: No  Follow-Up: Your next appointment:   Your physician recommends that you schedule a follow-up appointment in: 2 week follow-up with Dr. de [place].  You will receive a text message or e-mail with a link to a survey about your care and experience with [place] today! We would greatly appreciate your feedback!   Thanks for letting us be apart of your health journey!!  Primary Care and Sports Medicine   Dr. Arlina Robes [place]   We encourage you to activate your patient portal called "MyChart".  Sign up information is provided on this After Visit Summary.  MyChart is used to connect with patients for Virtual Visits (Telemedicine).  Patients are able to view lab/test results, encounter notes, upcoming appointments, etc.  Non-urgent messages can be sent to your provider as well. To learn more about what you can do with MyChart, please visit --  [email or web address].

## 2022-05-03 ENCOUNTER — Telehealth (HOSPITAL_BASED_OUTPATIENT_CLINIC_OR_DEPARTMENT_OTHER): Payer: Managed Care, Other (non HMO) | Admitting: Family Medicine

## 2022-05-20 ENCOUNTER — Other Ambulatory Visit (HOSPITAL_COMMUNITY): Payer: Self-pay

## 2022-05-28 ENCOUNTER — Other Ambulatory Visit (HOSPITAL_COMMUNITY): Payer: Self-pay

## 2022-06-02 ENCOUNTER — Encounter (HOSPITAL_BASED_OUTPATIENT_CLINIC_OR_DEPARTMENT_OTHER): Payer: Self-pay

## 2022-06-15 ENCOUNTER — Encounter (HOSPITAL_BASED_OUTPATIENT_CLINIC_OR_DEPARTMENT_OTHER): Payer: Self-pay | Admitting: Family Medicine

## 2022-06-15 ENCOUNTER — Ambulatory Visit (HOSPITAL_BASED_OUTPATIENT_CLINIC_OR_DEPARTMENT_OTHER): Payer: Managed Care, Other (non HMO) | Admitting: Family Medicine

## 2022-06-15 ENCOUNTER — Ambulatory Visit (INDEPENDENT_AMBULATORY_CARE_PROVIDER_SITE_OTHER): Payer: Managed Care, Other (non HMO)

## 2022-06-15 VITALS — BP 128/99 | HR 100 | Ht 67.0 in | Wt 125.4 lb

## 2022-06-15 DIAGNOSIS — S99922A Unspecified injury of left foot, initial encounter: Secondary | ICD-10-CM | POA: Diagnosis not present

## 2022-06-15 DIAGNOSIS — S99929A Unspecified injury of unspecified foot, initial encounter: Secondary | ICD-10-CM | POA: Insufficient documentation

## 2022-06-15 NOTE — Progress Notes (Signed)
    Procedures performed today:    None.  Independent interpretation of notes and tests performed by another provider:   None.  Brief History, Exam, Impression, and Recommendations:    BP (!) 128/99 (BP Location: Left Arm, Patient Position: Sitting, Cuff Size: Normal)   Pulse 100   Ht 5\' 7"  (1.702 m)   Wt 125 lb 6.4 oz (56.9 kg)   SpO2 100%   BMI 19.64 kg/m   Foot injury Patient reports that about 2 weeks ago, she had a 200 pound weight fall onto her left foot.  She has had pain in her mid/forefoot, mostly along medial aspect.  She has also had intermittent numbness/tingling over her second and third toes.  Pain has been mild.  She has not had any significant swelling or bruising noted.  She did mention symptoms to someone at work and his recommendation from them was to have further evaluation and imaging to further assess. On exam, patient is in no acute distress.  Left foot is normal expection with no significant swelling or bruising noted.  No tenderness about the ankle.  Mild tenderness to palpation near midshaft of first metatarsal, no other tenderness through the foot.  She has normal active and passive range of motion at left ankle.  Morton's neuroma testing does reproduce symptoms at second and third toes, faint click appreciated Based on exam, feel that fracture is less likely, although she does have some bony tenderness.  We will proceed with foot x-rays today for further evaluation Discussed possibility that current numbness/tingling sensation may be related to acute foot injury and, if so, would expect this to improve over the next 4 to 6 weeks If numbness/tingling symptoms are not improving as expected, would consider evaluation with podiatrist.  She will let know if symptoms persist and we can place referral at that time  Return if symptoms worsen or fail to improve.   ___________________________________________ Oley Lahaie de [place], MD, ABFM, CAQSM Primary Care and Sports  Medicine [facility]

## 2022-06-15 NOTE — Assessment & Plan Note (Signed)
Patient reports that about 2 weeks ago, she had a 200 pound weight fall onto her left foot.  She has had pain in her mid/forefoot, mostly along medial aspect.  She has also had intermittent numbness/tingling over her second and third toes.  Pain has been mild.  She has not had any significant swelling or bruising noted.  She did mention symptoms to someone at work and his recommendation from them was to have further evaluation and imaging to further assess. On exam, patient is in no acute distress.  Left foot is normal expection with no significant swelling or bruising noted.  No tenderness about the ankle.  Mild tenderness to palpation near midshaft of first metatarsal, no other tenderness through the foot.  She has normal active and passive range of motion at left ankle.  Morton's neuroma testing does reproduce symptoms at second and third toes, faint click appreciated Based on exam, feel that fracture is less likely, although she does have some bony tenderness.  We will proceed with foot x-rays today for further evaluation Discussed possibility that current numbness/tingling sensation may be related to acute foot injury and, if so, would expect this to improve over the next 4 to 6 weeks If numbness/tingling symptoms are not improving as expected, would consider evaluation with podiatrist.  She will let us know if symptoms persist and we can place referral at that time

## 2022-06-15 NOTE — Patient Instructions (Signed)
  Medication Instructions:  Your physician recommends that you continue on your current medications as directed. Please refer to the Current Medication list given to you today. --If you need a refill on any your medications before your next appointment, please call your pharmacy first. If no refills are authorized on file call the office.-- Lab Work: Your physician has recommended that you have lab work today: No If you have labs (blood work) drawn today and your tests are completely normal, you will receive your results via MyChart message OR a phone call from our staff.  Please ensure you check your voicemail in the event that you authorized detailed messages to be left on a delegated number. If you have any lab test that is abnormal or we need to change your treatment, we will call you to review the results.  Referrals/Procedures/Imaging: Yes, X-Ray  Follow-Up: Your next appointment:   Your physician recommends that you schedule a follow-up appointment as needed with Dr. de [place].  You will receive a text message or e-mail with a link to a survey about your care and experience with [place] today! We would greatly appreciate your feedback!   Thanks for letting us be apart of your health journey!!  Primary Care and Sports Medicine   Dr. Ceasar Mons Peru   We encourage you to activate your patient portal called "MyChart".  Sign up information is provided on this After Visit Summary.  MyChart is used to connect with patients for Virtual Visits (Telemedicine).  Patients are able to view lab/test results, encounter notes, upcoming appointments, etc.  Non-urgent messages can be sent to your provider as well. To learn more about what you can do with MyChart, please visit --  [email or web address].

## 2022-09-05 ENCOUNTER — Other Ambulatory Visit (HOSPITAL_COMMUNITY): Payer: Self-pay

## 2022-09-20 ENCOUNTER — Other Ambulatory Visit (HOSPITAL_BASED_OUTPATIENT_CLINIC_OR_DEPARTMENT_OTHER): Payer: Self-pay

## 2022-09-21 ENCOUNTER — Other Ambulatory Visit (HOSPITAL_BASED_OUTPATIENT_CLINIC_OR_DEPARTMENT_OTHER): Payer: Self-pay

## 2022-10-29 ENCOUNTER — Other Ambulatory Visit (HOSPITAL_BASED_OUTPATIENT_CLINIC_OR_DEPARTMENT_OTHER): Payer: Self-pay

## 2022-11-11 ENCOUNTER — Other Ambulatory Visit (HOSPITAL_BASED_OUTPATIENT_CLINIC_OR_DEPARTMENT_OTHER): Payer: Self-pay

## 2022-12-08 DIAGNOSIS — H40003 Preglaucoma, unspecified, bilateral: Secondary | ICD-10-CM | POA: Diagnosis not present

## 2022-12-27 ENCOUNTER — Other Ambulatory Visit (HOSPITAL_BASED_OUTPATIENT_CLINIC_OR_DEPARTMENT_OTHER): Payer: Self-pay

## 2023-03-24 ENCOUNTER — Other Ambulatory Visit: Payer: Self-pay | Admitting: Obstetrics & Gynecology

## 2023-03-24 DIAGNOSIS — N809 Endometriosis, unspecified: Secondary | ICD-10-CM

## 2023-03-24 DIAGNOSIS — E282 Polycystic ovarian syndrome: Secondary | ICD-10-CM

## 2023-03-29 ENCOUNTER — Other Ambulatory Visit (HOSPITAL_BASED_OUTPATIENT_CLINIC_OR_DEPARTMENT_OTHER): Payer: Self-pay

## 2023-03-29 MED ORDER — NORELGESTROMIN-ETH ESTRADIOL 150-35 MCG/24HR TD PTWK
1.0000 | MEDICATED_PATCH | TRANSDERMAL | 5 refills | Status: DC
  Filled 2023-03-29: qty 12, 84d supply, fill #0
  Filled 2023-06-19: qty 12, 84d supply, fill #1
  Filled 2023-09-18: qty 12, 84d supply, fill #2
  Filled 2023-12-12 – 2024-01-24 (×3): qty 12, 84d supply, fill #3
  Filled 2024-03-18: qty 12, 84d supply, fill #4

## 2023-04-27 ENCOUNTER — Encounter (HOSPITAL_BASED_OUTPATIENT_CLINIC_OR_DEPARTMENT_OTHER): Payer: Self-pay | Admitting: Family Medicine

## 2023-04-27 ENCOUNTER — Ambulatory Visit (INDEPENDENT_AMBULATORY_CARE_PROVIDER_SITE_OTHER): Payer: BC Managed Care – PPO | Admitting: Family Medicine

## 2023-04-27 VITALS — BP 128/82 | HR 101 | Ht 67.0 in | Wt 130.8 lb

## 2023-04-27 DIAGNOSIS — H5713 Ocular pain, bilateral: Secondary | ICD-10-CM | POA: Diagnosis not present

## 2023-04-27 DIAGNOSIS — Z23 Encounter for immunization: Secondary | ICD-10-CM

## 2023-04-27 DIAGNOSIS — H40003 Preglaucoma, unspecified, bilateral: Secondary | ICD-10-CM | POA: Diagnosis not present

## 2023-04-27 DIAGNOSIS — R519 Headache, unspecified: Secondary | ICD-10-CM | POA: Insufficient documentation

## 2023-04-27 DIAGNOSIS — G509 Disorder of trigeminal nerve, unspecified: Secondary | ICD-10-CM | POA: Diagnosis not present

## 2023-04-27 NOTE — Progress Notes (Signed)
    Procedures performed today:    None.  Independent interpretation of notes and tests performed by another provider:   None.  Brief History, Exam, Impression, and Recommendations:    BP 128/82 (BP Location: Right Arm, Patient Position: Sitting, Cuff Size: Normal)   Pulse (!) 101   Ht 5\' 7"  (1.702 m)   Wt 130 lb 12.8 oz (59.3 kg)   SpO2 98%   BMI 20.49 kg/m   Encounter for immunization -     Flu vaccine trivalent PF, 6mos and older(Flulaval,Afluria,Fluarix,Fluzone)  Unilateral headache Assessment & Plan: Patient reports that initially a few days ago she turned her head slightly to the right and had sudden sharp pain near right eye.  Indicates that it was just above and behind right eye.  This episode lasted for only several seconds and then resolved.  She did not have any further episodes that day.  She did note that on the following day, about 2 days ago, she went to put a hat on and again triggered some similar symptoms over right side of head.  She did not notice any vision changes with this, although indicates that her right eye is her "bad" eye.  She did see her eye doctor who did not have any concerns on testing.  More recently, she did have an episode of sneezing which also triggered some pain/headache for her as well.  She reports that at worst, headache was about an 8 out of 10.  Currently reports having 3 out of 10 symptoms.  Has not had any associated weakness or numbness or tingling.  She does have concerns due to family history of aneurysms and stroke. On exam today, patient is in no acute distress, vital signs stable.  Patient has normal gait in office today.  Pupils are equal, round, reactive to light and accommodation, normal extraocular eye movements.  She does not have any tenderness to palpation near origin of greater occipital nerve.  Normal thought content, normal speech, no slurring. Uncertain etiology for ongoing symptoms.  Given patient's concern and lack of clear  cause, can refer to neurology for further evaluation and recommendations, referral placed today  Orders: -     Ambulatory referral to Neurology   ___________________________________________ Sherri Litaker de [place], MD, ABFM, CAQSM Primary Care and Sports Medicine [facility]

## 2023-04-27 NOTE — Assessment & Plan Note (Signed)
Patient reports that initially a few days ago she turned her head slightly to the right and had sudden sharp pain near right eye.  Indicates that it was just above and behind right eye.  This episode lasted for only several seconds and then resolved.  She did not have any further episodes that day.  She did note that on the following day, about 2 days ago, she went to put a hat on and again triggered some similar symptoms over right side of head.  She did not notice any vision changes with this, although indicates that her right eye is her "bad" eye.  She did see her eye doctor who did not have any concerns on testing.  More recently, she did have an episode of sneezing which also triggered some pain/headache for her as well.  She reports that at worst, headache was about an 8 out of 10.  Currently reports having 3 out of 10 symptoms.  Has not had any associated weakness or numbness or tingling.  She does have concerns due to family history of aneurysms and stroke. On exam today, patient is in no acute distress, vital signs stable.  Patient has normal gait in office today.  Pupils are equal, round, reactive to light and accommodation, normal extraocular eye movements.  She does not have any tenderness to palpation near origin of greater occipital nerve.  Normal thought content, normal speech, no slurring. Uncertain etiology for ongoing symptoms.  Given patient's concern and lack of clear cause, can refer to neurology for further evaluation and recommendations, referral placed today

## 2023-06-22 ENCOUNTER — Encounter (HOSPITAL_BASED_OUTPATIENT_CLINIC_OR_DEPARTMENT_OTHER): Payer: Self-pay | Admitting: Family Medicine

## 2023-06-23 ENCOUNTER — Other Ambulatory Visit (HOSPITAL_BASED_OUTPATIENT_CLINIC_OR_DEPARTMENT_OTHER): Payer: Self-pay

## 2023-06-23 ENCOUNTER — Telehealth (INDEPENDENT_AMBULATORY_CARE_PROVIDER_SITE_OTHER): Payer: BC Managed Care – PPO | Admitting: Family Medicine

## 2023-06-23 DIAGNOSIS — F909 Attention-deficit hyperactivity disorder, unspecified type: Secondary | ICD-10-CM | POA: Insufficient documentation

## 2023-06-23 MED ORDER — METHYLPHENIDATE HCL ER (OSM) 18 MG PO TBCR
18.0000 mg | EXTENDED_RELEASE_TABLET | Freq: Every day | ORAL | 0 refills | Status: DC
Start: 2023-06-23 — End: 2023-09-18
  Filled 2023-06-23 – 2023-06-27 (×2): qty 30, 30d supply, fill #0

## 2023-06-23 NOTE — Progress Notes (Signed)
   Virtual Visit   I connected with  Sherri Welch  on 06/23/23 by telehealth and verified that I am speaking with the correct person using two identifiers. Visit completed via video.  Did have poor connection with video service, did switch to telephone to complete visit.  Greater than 50% of visit was completed utilizing video services.  I discussed the limitations, risks, security and privacy concerns of performing an evaluation and management service by telephone, including the higher likelihood of inaccurate diagnosis and treatment, and the availability of in person appointments.  We also discussed the likely need of an additional face to face encounter for complete and high quality delivery of care.  I also discussed with the patient that there may be a patient responsible charge related to this service. The patient expressed understanding and wishes to proceed.  Provider location is in medical facility. Patient location is at their home, different from provider location. People involved in care of the patient during this telehealth encounter were myself, my nurse/medical assistant, and my front office/scheduling team member.  Review of Systems: No fevers, chills, night sweats, weight loss, chest pain, or shortness of breath.   Objective Findings:    General: Speaking full sentences, no audible heavy breathing.  Sounds alert and appropriately interactive.    Independent interpretation of tests performed by another provider:   None.  Brief History, Exam, Impression, and Recommendations:    ADHD Initially diagnosed when she was 32 years old and has been on various medications in the past including Adderall and Concerta.  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  She does feel that she had better symptom control with use of Concerta, however felt that she did have some notable side effects when taking higher dose at 54 mg  daily.  She mostly noted some irritability, depressed mood.  She denies significant issues with appetite or sleep. We did review medication considerations.  We can proceed with restarting Concerta, will start with low-dose of medication at 18 mg and assess response.  Can further titrate dose of medication pending progress with this dose.  PDMP reviewed, no red flags.  We will plan for follow-up in about 3 to 4 weeks to assess progress and medication or sooner as needed.  I discussed the above assessment and treatment plan with the patient. The patient was provided an opportunity to ask questions and all were answered. The patient agreed with the plan and demonstrated an understanding of the instructions.  The patient was advised to call back or seek an in-person evaluation if the symptoms worsen or if the condition fails to improve as anticipated.  I provided 12 minutes of face to face and non-face-to-face time during this encounter date, time was needed to gather information, review chart, records, communicate/coordinate with staff remotely, as well as complete documentation.   ___________________________________________ Kirbi Farrugia de [place], MD, ABFM, CAQSM Primary Care and Sports Medicine [facility]

## 2023-06-23 NOTE — Assessment & Plan Note (Signed)
Initially diagnosed when she was 32 years old and has been on various medications in the past including Adderall and Concerta.  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  She does feel that she had better symptom control with use of Concerta, however felt that she did have some notable side effects when taking higher dose at 54 mg daily.  She mostly noted some irritability, depressed mood.  She denies significant issues with appetite or sleep. We did review medication considerations.  We can proceed with restarting Concerta, will start with low-dose of medication at 18 mg and assess response.  Can further titrate dose of medication pending progress with this dose.  PDMP reviewed, no red flags.  We will plan for follow-up in about 3 to 4 weeks to assess progress and medication or sooner as needed.

## 2023-06-24 ENCOUNTER — Other Ambulatory Visit (HOSPITAL_BASED_OUTPATIENT_CLINIC_OR_DEPARTMENT_OTHER): Payer: Self-pay

## 2023-06-27 ENCOUNTER — Other Ambulatory Visit (HOSPITAL_BASED_OUTPATIENT_CLINIC_OR_DEPARTMENT_OTHER): Payer: Self-pay

## 2023-07-26 ENCOUNTER — Ambulatory Visit: Payer: BC Managed Care – PPO | Admitting: Diagnostic Neuroimaging

## 2023-07-26 ENCOUNTER — Ambulatory Visit: Payer: BC Managed Care – PPO | Admitting: Obstetrics & Gynecology

## 2023-07-26 ENCOUNTER — Encounter: Payer: Self-pay | Admitting: Obstetrics & Gynecology

## 2023-07-26 ENCOUNTER — Encounter: Payer: Self-pay | Admitting: Diagnostic Neuroimaging

## 2023-07-26 ENCOUNTER — Other Ambulatory Visit (HOSPITAL_COMMUNITY)
Admission: RE | Admit: 2023-07-26 | Discharge: 2023-07-26 | Disposition: A | Discharge status: Home / Self Care | Payer: BC Managed Care – PPO | Source: Ambulatory Visit | Attending: Obstetrics & Gynecology | Admitting: Obstetrics & Gynecology

## 2023-07-26 VITALS — BP 120/82 | Ht 67.0 in | Wt 130.0 lb

## 2023-07-26 DIAGNOSIS — Z1151 Encounter for screening for human papillomavirus (HPV): Secondary | ICD-10-CM | POA: Diagnosis not present

## 2023-07-26 DIAGNOSIS — R519 Headache, unspecified: Secondary | ICD-10-CM

## 2023-07-26 DIAGNOSIS — Z3045 Encounter for surveillance of transdermal patch hormonal contraceptive device: Secondary | ICD-10-CM

## 2023-07-26 DIAGNOSIS — G4489 Other headache syndrome: Secondary | ICD-10-CM

## 2023-07-26 DIAGNOSIS — Z113 Encounter for screening for infections with a predominantly sexual mode of transmission: Secondary | ICD-10-CM | POA: Diagnosis not present

## 2023-07-26 DIAGNOSIS — Z8249 Family history of ischemic heart disease and other diseases of the circulatory system: Secondary | ICD-10-CM

## 2023-07-26 DIAGNOSIS — Z01419 Encounter for gynecological examination (general) (routine) without abnormal findings: Secondary | ICD-10-CM | POA: Insufficient documentation

## 2023-07-26 NOTE — Patient Instructions (Signed)
  RIGHT SIDED HEADACHE (new onset, Oct 2024; sudden, sharp, stabbing) - check MRI brain / MRA head - check labs (follow up iron deficiency anemia; rule out other metabolic issues)

## 2023-07-26 NOTE — Progress Notes (Signed)
[facility]  PATIENT: Sherri Welch DOB: 1990/11/09  REFERRING CLINICIAN: de [place], Raymond J, MD HISTORY FROM: patient  REASON FOR VISIT: new consult    HISTORICAL  CHIEF COMPLAINT:  Chief Complaint  Patient presents with   New Patient (Initial Visit)    Rm 6. Alone. NP/internal referral for headaches. Reports pain that occurs behind right eye around 4 times per day. Has recently improved. Describes ice pick type headache on 04/22/23.    HISTORY OF PRESENT ILLNESS:   33 year old female here for evaluation of headaches.  In October 2024 had onset of intermittent pain around her right side and right eye lasting for few seconds at a time.  This occurred with sneezing, turning her head or putting a hat on.  She had 1 episode of throbbing headaches in the last 2 days.  This was associate with some dizziness lightheadedness.  No nausea or vomiting.  No sensitive light or sound.  Since that time symptoms have essentially resolved.   REVIEW OF SYSTEMS: Full 14 system review of systems performed and negative with exception of: as per HPI.  ALLERGIES: Allergies  Allergen Reactions   Chapstick Hives    Other reaction(s): Lip Swelling   Iodinated Contrast Media Itching    Itching and hives.     HOME MEDICATIONS: Outpatient Medications Prior to Visit  Medication Sig Dispense Refill   methylphenidate (CONCERTA) 18 MG PO CR tablet Take 1 tablet (18 mg total) by mouth daily. 30 tablet 0   norelgestromin-ethinyl estradiol (ZAFEMY) 150-35 MCG/24HR transdermal patch Place 1 patch onto the skin once a week. 12 patch 5   escitalopram (LEXAPRO) 5 MG tablet Take 1 tablet (5 mg total) by mouth daily. (Patient not taking: Reported on 04/27/2023) 30 tablet 1   No facility-administered medications prior to visit.    PAST MEDICAL HISTORY: Past Medical History:  Diagnosis Date   ADHD (attention deficit hyperactivity disorder)    Allergy    Anemia    Arthritis    knees  and back   Blood transfusion without reported diagnosis    Complex regional pain syndrome type 1 affecting right upper arm 07/15/2017   Depression 04/15/2022   Endometriosis determined by laparoscopy 12/28/2019   GERD (gastroesophageal reflux disease)    Heart murmur    PCOS (polycystic ovarian syndrome)     PAST SURGICAL HISTORY: Past Surgical History:  Procedure Laterality Date   LAPAROSCOPIC UNILATERAL SALPINGECTOMY Right 12/28/2019   Removal of Hydrosalpinx, diagnosed with endometriosis (pathology). Done at [facility]    FAMILY HISTORY: Family History  Problem Relation Age of Onset   Asthma Mother    Drug abuse Mother    Miscarriages / [place] Mother    Aneurysm Mother    Allergies Brother    Asthma Brother    Learning disabilities Brother    Asthma Brother    ADD / ADHD Brother    Stroke Maternal Aunt    Heart disease Maternal Grandmother    Miscarriages / Stillbirths Maternal Grandmother    Stroke Maternal Grandfather    Stroke Paternal Grandmother     SOCIAL HISTORY: Social History   Socioeconomic History   Marital status: Single    Spouse name: Not on file   Number of children: 0   Years of education: Not on file   Highest education level: Bachelor's degree (e.g., BA, AB, BS)  Occupational History   Occupation: Student  Tobacco Use   Smoking status: Never   Smokeless tobacco: Never  Vaping  Use   Vaping status: Never Used  Substance and Sexual Activity   Alcohol use: Yes    Comment: socially   Drug use: No   Sexual activity: Not Currently    Birth control/protection: Patch  Other Topics Concern   Not on file  Social History Narrative   Not on file   Social Drivers of Health   Financial Resource Strain: Low Risk  (04/26/2023)   Overall Financial Resource Strain (CARDIA)    Difficulty of Paying Living Expenses: Not very hard  Food Insecurity: Food Insecurity Present (04/26/2023)   Hunger Vital Sign    Worried About Running Out of Food in the  Last Year: Sometimes true    Ran Out of Food in the Last Year: Never true  Transportation Needs: No Transportation Needs (04/26/2023)   PRAPARE - Administrator, Civil Service (Medical): No    Lack of Transportation (Non-Medical): No  Physical Activity: Insufficiently Active (04/26/2023)   Exercise Vital Sign    Days of Exercise per Week: 3 days    Minutes of Exercise per Session: 20 min  Stress: No Stress Concern Present (04/26/2023)   [employer] of Occupational Health - Occupational Stress Questionnaire    Feeling of Stress : Not at all  Social Connections: Moderately Isolated (04/26/2023)   Social Connection and Isolation Panel [NHANES]    Frequency of Communication with Friends and Family: More than three times a week    Frequency of Social Gatherings with Friends and Family: Once a week    Attends Religious Services: 1 to 4 times per year    Active Member of [employer] or Organizations: No    Attends Engineer, structural: Not on file    Marital Status: Never married  Intimate Partner Violence: Not on file     PHYSICAL EXAM  GENERAL EXAM/CONSTITUTIONAL: Vitals:  Vitals:   07/26/23 0837  BP: 120/82  Weight: 130 lb (59 kg)  Height: 5\' 7"  (1.702 m)   Body mass index is 20.36 kg/m. Wt Readings from Last 3 Encounters:  07/26/23 130 lb (59 kg)  04/27/23 130 lb 12.8 oz (59.3 kg)  06/15/22 125 lb 6.4 oz (56.9 kg)   Patient is in no distress; well developed, nourished and groomed; neck is supple  CARDIOVASCULAR: Examination of carotid arteries is normal; no carotid bruits Regular rate and rhythm, no murmurs Examination of peripheral vascular system by observation and palpation is normal  EYES: Ophthalmoscopic exam of optic discs and posterior segments is normal; no papilledema or hemorrhages No results found.  MUSCULOSKELETAL: Gait, strength, tone, movements noted in Neurologic exam below  NEUROLOGIC: MENTAL STATUS:      No data to display          awake, alert, oriented to person, place and time recent and remote memory intact normal attention and concentration language fluent, comprehension intact, naming intact fund of knowledge appropriate  CRANIAL NERVE:  2nd - no papilledema on fundoscopic exam 2nd, 3rd, 4th, 6th - pupils equal and reactive to light, visual fields full to confrontation, extraocular muscles intact, no nystagmus 5th - facial sensation symmetric 7th - facial strength symmetric 8th - hearing intact 9th - palate elevates symmetrically, uvula midline 11th - shoulder shrug symmetric 12th - tongue protrusion midline  MOTOR:  normal bulk and tone, full strength in the BUE, BLE  SENSORY:  normal and symmetric to light touch, temperature, vibration  COORDINATION:  finger-nose-finger, fine finger movements normal  REFLEXES:  deep tendon reflexes present  and symmetric  GAIT/STATION:  narrow based gait     DIAGNOSTIC DATA (LABS, IMAGING, TESTING) - I reviewed patient records, labs, notes, testing and imaging myself where available.  Lab Results  Component Value Date   WBC 5.9 06/15/2021   HGB 10.2 (L) 06/15/2021   HCT 32.5 (L) 06/15/2021   MCV 83 06/15/2021   PLT 255 06/15/2021      Component Value Date/Time   NA 141 03/17/2021 1032   K 4.9 03/17/2021 1032   CL 105 03/17/2021 1032   CO2 23 03/17/2021 1032   GLUCOSE 75 03/17/2021 1032   GLUCOSE 106 (H) 01/26/2015 1635   BUN 12 03/17/2021 1032   CREATININE 0.57 03/17/2021 1032   CALCIUM 9.4 03/17/2021 1032   PROT 7.4 01/26/2015 1635   ALBUMIN 4.1 01/26/2015 1635   AST 17 01/26/2015 1635   ALT 11 (L) 01/26/2015 1635   ALKPHOS 47 01/26/2015 1635   BILITOT 0.4 01/26/2015 1635   GFRNONAA >60 01/26/2015 1635   GFRAA >60 01/26/2015 1635   No results found for: "CHOL", "HDL", "LDLCALC", "LDLDIRECT", "TRIG", "CHOLHDL" Lab Results  Component Value Date   HGBA1C 5.4 06/15/2021   Lab Results  Component Value Date   VITAMINB12 345  03/17/2021   Lab Results  Component Value Date   TSH 2.570 03/17/2021       ASSESSMENT AND PLAN  33 y.o. year old female here with:   Dx:  1. New onset headache   2. Family history of cerebral aneurysm   3. Other headache syndrome     PLAN:  RIGHT SIDED HEADACHE (new onset, Oct 2024; sudden, sharp, stabbing) - check MRI brain / MRA head (family history of brain aneurysm in mother and cousin) - check labs (follow up iron deficiency anemia; rule out other metabolic issues)  Orders Placed This Encounter  Procedures   MR BRAIN W WO CONTRAST   MR ANGIO HEAD WO CONTRAST   CBC with Differential/Platelet   Comprehensive metabolic panel   Iron, TIBC and Ferritin Panel   Return for pending if symptoms worsen or fail to improve, pending test results.    Suanne Marker, MD 07/26/2023, 9:12 AM Certified in Neurology, Neurophysiology and Neuroimaging  [facility] [address] [place], [place] [address] [phone]

## 2023-07-26 NOTE — Progress Notes (Signed)
GYNECOLOGY ANNUAL PREVENTATIVE CARE ENCOUNTER NOTE  History:     Sherri Welch is a 33 y.o. G0P0000 female here for a routine annual gynecologic exam.  Current complaints: skin tags on thighs, wants to make sure it is nothing else.  History of endometriosis, managed on Xulane patches.   Denies abnormal vaginal bleeding, discharge, pelvic pain, problems with intercourse or other gynecologic concerns.    Gynecologic History No LMP recorded. Contraception:  Burr Medico Last Pap: 10/24/2019. Result was normal   Obstetric History OB History  Gravida Para Term Preterm AB Living  0 0 0 0 0 0  SAB IAB Ectopic Multiple Live Births  0 0 0 0     Past Medical History:  Diagnosis Date   ADHD (attention deficit hyperactivity disorder)    Allergy    Anemia    Arthritis    knees and back   Blood transfusion without reported diagnosis    Complex regional pain syndrome type 1 affecting right upper arm 07/15/2017   Depression 04/15/2022   Endometriosis determined by laparoscopy 12/28/2019   GERD (gastroesophageal reflux disease)    Heart murmur    PCOS (polycystic ovarian syndrome)     Past Surgical History:  Procedure Laterality Date   LAPAROSCOPIC UNILATERAL SALPINGECTOMY Right 12/28/2019   Removal of Hydrosalpinx, diagnosed with endometriosis (pathology). Done at [facility].    Current Outpatient Medications on File Prior to Visit  Medication Sig Dispense Refill   methylphenidate (CONCERTA) 18 MG PO CR tablet Take 1 tablet (18 mg total) by mouth daily. 30 tablet 0   norelgestromin-ethinyl estradiol (ZAFEMY) 150-35 MCG/24HR transdermal patch Place 1 patch onto the skin once a week. 12 patch 5   No current facility-administered medications on file prior to visit.    Allergies  Allergen Reactions   Chapstick Hives    Other reaction(s): Lip Swelling   Iodinated Contrast Media Itching    Itching and hives.     Social History:  reports that she has never smoked. She has never used  smokeless tobacco. She reports current alcohol use. She reports that she does not use drugs.  Family History  Problem Relation Age of Onset   Asthma Mother    Drug abuse Mother    Miscarriages / [place] Mother    Aneurysm Mother    Allergies Brother    Asthma Brother    Learning disabilities Brother    Asthma Brother    ADD / ADHD Brother    Stroke Maternal Aunt    Heart disease Maternal Grandmother    Miscarriages / Stillbirths Maternal Grandmother    Stroke Maternal Grandfather    Stroke Paternal Grandmother     The following portions of the patient's history were reviewed and updated as appropriate: allergies, current medications, past family history, past medical history, past social history, past surgical history and problem list.  Review of Systems Pertinent items noted in HPI and remainder of comprehensive ROS otherwise negative.  Physical Exam:  BP 120/82   Ht 5\' 7"  (1.702 m)   Wt 130 lb (59 kg)   BMI 20.36 kg/m  CONSTITUTIONAL: Well-developed, well-nourished female in no acute distress.  HENT:  Normocephalic, atraumatic, External right and left ear normal.  EYES: Conjunctivae and EOM are normal. Pupils are equal, round, and reactive to light. No scleral icterus.  NECK: Normal range of motion, supple, no masses.  Normal thyroid.  SKIN: Skin is warm and dry. No rash noted. Not diaphoretic. No erythema. No pallor. MUSCULOSKELETAL: Normal  range of motion. No tenderness.  No cyanosis, clubbing, or edema. NEUROLOGIC: Alert and oriented to person, place, and time. Normal reflexes, muscle tone coordination.  PSYCHIATRIC: Normal mood and affect. Normal behavior. Normal judgment and thought content. CARDIOVASCULAR: Normal heart rate noted, regular rhythm RESPIRATORY: Clear to auscultation bilaterally. Effort and breath sounds normal, no problems with respiration noted. BREASTS: Symmetric in size. No masses, tenderness, skin changes, nipple drainage, or lymphadenopathy  bilaterally. Performed in the presence of a chaperone. ABDOMEN: Soft, no distention noted.  No tenderness, rebound or guarding.  PELVIC: Normal appearing external genitalia and urethral meatus; normal appearing vaginal mucosa and cervix.  No abnormal vaginal discharge noted.  Pap smear obtained.  Normal uterine size, no other palpable masses, no uterine or adnexal tenderness.  Small round skin tag noted on left inner thigh, smooth surface, no concerning characteristics. Performed in the presence of a chaperone.   Assessment and Plan:     1. Encounter for surveillance of transdermal patch hormonal contraceptive device Doing well.  Already has refills.  2. Routine screening for STI (sexually transmitted infection) Desired annual STI screen, will follow up results and manage accordingly. - Cytology - PAP ancillary testing - RPR+HBsAg+HCVAb+HIV  3. Well woman exam with routine gynecological exam (Primary) - Cytology - PAP Will follow up results of pap smear and manage accordingly. Normal breast examination today, she was advised to perform periodic self breast examinations.  She was assured that she just has a skin tag, can follow up Dermatology for excision if desired. Routine preventative health maintenance measures emphasized. Please refer to After Visit Summary for other counseling recommendations.      Jaynie Collins, MD, FACOG Obstetrician & Gynecologist, [facility] for [employer], [facility]

## 2023-07-27 ENCOUNTER — Encounter: Payer: Self-pay | Admitting: Obstetrics & Gynecology

## 2023-07-27 LAB — RPR+HBSAG+HCVAB+...
HIV Screen 4th Generation wRfx: NONREACTIVE
Hep C Virus Ab: NONREACTIVE
Hepatitis B Surface Ag: NEGATIVE
RPR Ser Ql: NONREACTIVE

## 2023-07-27 LAB — COMPREHENSIVE METABOLIC PANEL
ALT: 6 [IU]/L (ref 0–32)
AST: 13 [IU]/L (ref 0–40)
Albumin: 4 g/dL (ref 3.9–4.9)
Alkaline Phosphatase: 39 [IU]/L — ABNORMAL LOW (ref 44–121)
BUN/Creatinine Ratio: 17 (ref 9–23)
BUN: 9 mg/dL (ref 6–20)
Bilirubin Total: 0.3 mg/dL (ref 0.0–1.2)
CO2: 25 mmol/L (ref 20–29)
Calcium: 8.8 mg/dL (ref 8.7–10.2)
Chloride: 105 mmol/L (ref 96–106)
Creatinine, Ser: 0.53 mg/dL — ABNORMAL LOW (ref 0.57–1.00)
Globulin, Total: 2.8 g/dL (ref 1.5–4.5)
Glucose: 80 mg/dL (ref 70–99)
Potassium: 3.6 mmol/L (ref 3.5–5.2)
Sodium: 141 mmol/L (ref 134–144)
Total Protein: 6.8 g/dL (ref 6.0–8.5)
eGFR: 127 mL/min/{1.73_m2} (ref >59)

## 2023-07-27 LAB — CBC WITH DIFFERENTIAL/PLATELET
Basophils Absolute: 0 10*3/uL (ref 0.0–0.2)
Basos: 0 %
EOS (ABSOLUTE): 0.1 10*3/uL (ref 0.0–0.4)
Eos: 1 %
Hematocrit: 35.8 % (ref 34.0–46.6)
Hemoglobin: 11.2 g/dL (ref 11.1–15.9)
Immature Grans (Abs): 0 10*3/uL (ref 0.0–0.1)
Immature Granulocytes: 0 %
Lymphocytes Absolute: 2 10*3/uL (ref 0.7–3.1)
Lymphs: 38 %
MCH: 28.9 pg (ref 26.6–33.0)
MCHC: 31.3 g/dL — ABNORMAL LOW (ref 31.5–35.7)
MCV: 93 fL (ref 79–97)
Monocytes Absolute: 0.5 10*3/uL (ref 0.1–0.9)
Monocytes: 10 %
Neutrophils Absolute: 2.7 10*3/uL (ref 1.4–7.0)
Neutrophils: 51 %
Platelets: 278 10*3/uL (ref 150–450)
RBC: 3.87 x10E6/uL (ref 3.77–5.28)
RDW: 13.7 % (ref 11.7–15.4)
WBC: 5.3 10*3/uL (ref 3.4–10.8)

## 2023-07-27 LAB — IRON,TIBC AND FERRITIN PANEL
Ferritin: 13 ng/mL — ABNORMAL LOW (ref 15–150)
Iron Saturation: 13 % — ABNORMAL LOW (ref 15–55)
Iron: 51 ug/dL (ref 27–159)
Total Iron Binding Capacity: 393 ug/dL (ref 250–450)
UIBC: 342 ug/dL (ref 131–425)

## 2023-07-29 NOTE — Progress Notes (Signed)
Unremarkable labs.  Anemia and iron levels are improving.  -VRP

## 2023-08-01 LAB — CYTOLOGY - PAP
Chlamydia: NEGATIVE
Comment: NEGATIVE
Comment: NEGATIVE
Comment: NEGATIVE
Comment: NORMAL
Diagnosis: NEGATIVE
High risk HPV: NEGATIVE
Neisseria Gonorrhea: NEGATIVE
Trichomonas: NEGATIVE

## 2023-08-02 ENCOUNTER — Ambulatory Visit: Payer: BC Managed Care – PPO

## 2023-08-02 DIAGNOSIS — R519 Headache, unspecified: Secondary | ICD-10-CM

## 2023-08-02 DIAGNOSIS — Z8249 Family history of ischemic heart disease and other diseases of the circulatory system: Secondary | ICD-10-CM | POA: Diagnosis not present

## 2023-08-03 ENCOUNTER — Encounter: Payer: Self-pay | Admitting: Obstetrics & Gynecology

## 2023-08-03 ENCOUNTER — Ambulatory Visit: Payer: BC Managed Care – PPO

## 2023-08-03 DIAGNOSIS — Z8249 Family history of ischemic heart disease and other diseases of the circulatory system: Secondary | ICD-10-CM

## 2023-08-03 DIAGNOSIS — R519 Headache, unspecified: Secondary | ICD-10-CM

## 2023-08-03 DIAGNOSIS — G4489 Other headache syndrome: Secondary | ICD-10-CM

## 2023-08-03 MED ORDER — GADOBENATE DIMEGLUMINE 529 MG/ML IV SOLN
10.0000 mL | Freq: Once | INTRAVENOUS | Status: AC | PRN
  Administered 2023-08-03: 10 mL via INTRAVENOUS

## 2023-08-08 NOTE — Progress Notes (Signed)
 Results are good, no major findings. Continue current plan. -VRP

## 2023-08-10 ENCOUNTER — Encounter (HOSPITAL_BASED_OUTPATIENT_CLINIC_OR_DEPARTMENT_OTHER): Payer: Self-pay | Admitting: Family Medicine

## 2023-08-10 ENCOUNTER — Encounter: Payer: Self-pay | Admitting: Obstetrics & Gynecology

## 2023-08-11 ENCOUNTER — Other Ambulatory Visit (HOSPITAL_BASED_OUTPATIENT_CLINIC_OR_DEPARTMENT_OTHER): Payer: Self-pay | Admitting: *Deleted

## 2023-08-11 DIAGNOSIS — L989 Disorder of the skin and subcutaneous tissue, unspecified: Secondary | ICD-10-CM

## 2023-08-15 DIAGNOSIS — D485 Neoplasm of uncertain behavior of skin: Secondary | ICD-10-CM | POA: Diagnosis not present

## 2023-08-15 DIAGNOSIS — B081 Molluscum contagiosum: Secondary | ICD-10-CM | POA: Diagnosis not present

## 2023-08-16 ENCOUNTER — Ambulatory Visit: Payer: BC Managed Care – PPO | Admitting: Diagnostic Neuroimaging

## 2023-08-25 ENCOUNTER — Ambulatory Visit (HOSPITAL_BASED_OUTPATIENT_CLINIC_OR_DEPARTMENT_OTHER): Payer: BC Managed Care – PPO | Admitting: Family Medicine

## 2023-08-25 ENCOUNTER — Encounter (HOSPITAL_BASED_OUTPATIENT_CLINIC_OR_DEPARTMENT_OTHER): Payer: Self-pay | Admitting: *Deleted

## 2023-08-29 ENCOUNTER — Ambulatory Visit (HOSPITAL_BASED_OUTPATIENT_CLINIC_OR_DEPARTMENT_OTHER): Payer: BC Managed Care – PPO | Admitting: Family Medicine

## 2023-09-18 ENCOUNTER — Other Ambulatory Visit (HOSPITAL_BASED_OUTPATIENT_CLINIC_OR_DEPARTMENT_OTHER): Payer: Self-pay | Admitting: Family Medicine

## 2023-09-19 ENCOUNTER — Other Ambulatory Visit (HOSPITAL_BASED_OUTPATIENT_CLINIC_OR_DEPARTMENT_OTHER): Payer: Self-pay

## 2023-09-20 ENCOUNTER — Other Ambulatory Visit (HOSPITAL_BASED_OUTPATIENT_CLINIC_OR_DEPARTMENT_OTHER): Payer: Self-pay

## 2023-09-20 MED ORDER — METHYLPHENIDATE HCL ER (OSM) 18 MG PO TBCR
18.0000 mg | EXTENDED_RELEASE_TABLET | Freq: Every day | ORAL | 0 refills | Status: DC
  Filled 2023-09-20: qty 30, 30d supply, fill #0

## 2023-11-07 DIAGNOSIS — M5412 Radiculopathy, cervical region: Secondary | ICD-10-CM | POA: Diagnosis not present

## 2023-11-07 DIAGNOSIS — M7912 Myalgia of auxiliary muscles, head and neck: Secondary | ICD-10-CM | POA: Diagnosis not present

## 2023-11-07 DIAGNOSIS — R51 Headache with orthostatic component, not elsewhere classified: Secondary | ICD-10-CM | POA: Diagnosis not present

## 2023-11-07 DIAGNOSIS — M5413 Radiculopathy, cervicothoracic region: Secondary | ICD-10-CM | POA: Diagnosis not present

## 2023-11-14 DIAGNOSIS — M5413 Radiculopathy, cervicothoracic region: Secondary | ICD-10-CM | POA: Diagnosis not present

## 2023-11-14 DIAGNOSIS — R51 Headache with orthostatic component, not elsewhere classified: Secondary | ICD-10-CM | POA: Diagnosis not present

## 2023-11-14 DIAGNOSIS — M5412 Radiculopathy, cervical region: Secondary | ICD-10-CM | POA: Diagnosis not present

## 2023-11-14 DIAGNOSIS — M7912 Myalgia of auxiliary muscles, head and neck: Secondary | ICD-10-CM | POA: Diagnosis not present

## 2023-12-12 ENCOUNTER — Other Ambulatory Visit (HOSPITAL_BASED_OUTPATIENT_CLINIC_OR_DEPARTMENT_OTHER): Payer: Self-pay | Admitting: Family Medicine

## 2023-12-12 ENCOUNTER — Other Ambulatory Visit (HOSPITAL_BASED_OUTPATIENT_CLINIC_OR_DEPARTMENT_OTHER): Payer: Self-pay

## 2023-12-12 ENCOUNTER — Other Ambulatory Visit: Payer: Self-pay

## 2023-12-12 DIAGNOSIS — H40003 Preglaucoma, unspecified, bilateral: Secondary | ICD-10-CM | POA: Diagnosis not present

## 2023-12-12 MED ORDER — METHYLPHENIDATE HCL ER (OSM) 18 MG PO TBCR
18.0000 mg | EXTENDED_RELEASE_TABLET | Freq: Every day | ORAL | 0 refills | Status: AC
  Filled 2023-12-12 – 2024-01-11 (×2): qty 30, 30d supply, fill #0

## 2023-12-22 ENCOUNTER — Other Ambulatory Visit (HOSPITAL_BASED_OUTPATIENT_CLINIC_OR_DEPARTMENT_OTHER): Payer: Self-pay

## 2023-12-23 ENCOUNTER — Other Ambulatory Visit (HOSPITAL_BASED_OUTPATIENT_CLINIC_OR_DEPARTMENT_OTHER): Payer: Self-pay

## 2024-01-11 ENCOUNTER — Other Ambulatory Visit: Payer: Self-pay

## 2024-01-11 ENCOUNTER — Other Ambulatory Visit (HOSPITAL_BASED_OUTPATIENT_CLINIC_OR_DEPARTMENT_OTHER): Payer: Self-pay

## 2024-01-24 ENCOUNTER — Other Ambulatory Visit (HOSPITAL_BASED_OUTPATIENT_CLINIC_OR_DEPARTMENT_OTHER): Payer: Self-pay

## 2024-02-02 ENCOUNTER — Telehealth: Payer: Self-pay | Admitting: Family Medicine

## 2024-02-02 NOTE — Telephone Encounter (Signed)
 Mom and sister are current patients she is asking if she may establish care with Dr KANDICE as well.

## 2024-02-07 ENCOUNTER — Other Ambulatory Visit: Payer: Self-pay | Admitting: *Deleted

## 2024-02-07 ENCOUNTER — Encounter: Payer: Self-pay | Admitting: Obstetrics & Gynecology

## 2024-02-07 ENCOUNTER — Other Ambulatory Visit (HOSPITAL_COMMUNITY): Payer: Self-pay

## 2024-02-07 ENCOUNTER — Other Ambulatory Visit (HOSPITAL_BASED_OUTPATIENT_CLINIC_OR_DEPARTMENT_OTHER): Payer: Self-pay

## 2024-02-07 ENCOUNTER — Encounter (HOSPITAL_BASED_OUTPATIENT_CLINIC_OR_DEPARTMENT_OTHER): Payer: Self-pay | Admitting: Family Medicine

## 2024-02-07 MED ORDER — FLUCONAZOLE 150 MG PO TABS
150.0000 mg | ORAL_TABLET | Freq: Once | ORAL | 3 refills | Status: AC
  Filled 2024-02-07 (×2): qty 1, 1d supply, fill #0
  Filled 2024-02-10: qty 1, 1d supply, fill #1

## 2024-02-07 NOTE — Telephone Encounter (Addendum)
Unfortunately I am not taking new patients at this time.  

## 2024-02-08 ENCOUNTER — Other Ambulatory Visit (HOSPITAL_COMMUNITY): Payer: Self-pay

## 2024-02-08 NOTE — Telephone Encounter (Signed)
 Please call patient and let know that we can't set up with Dr. KANDICE but offer app with another provider here.

## 2024-02-08 NOTE — Telephone Encounter (Signed)
 Lvm to schedule with any provider accepting new patients but Dr KANDICE is not accepting new patients at this time.

## 2024-02-10 ENCOUNTER — Other Ambulatory Visit (HOSPITAL_BASED_OUTPATIENT_CLINIC_OR_DEPARTMENT_OTHER): Payer: Self-pay

## 2024-02-21 ENCOUNTER — Ambulatory Visit: Admitting: Obstetrics & Gynecology

## 2024-03-09 ENCOUNTER — Other Ambulatory Visit (HOSPITAL_BASED_OUTPATIENT_CLINIC_OR_DEPARTMENT_OTHER): Payer: Self-pay

## 2024-03-09 ENCOUNTER — Other Ambulatory Visit (HOSPITAL_COMMUNITY): Payer: Self-pay

## 2024-03-09 ENCOUNTER — Other Ambulatory Visit: Payer: Self-pay | Admitting: *Deleted

## 2024-03-09 ENCOUNTER — Encounter: Payer: Self-pay | Admitting: Obstetrics & Gynecology

## 2024-03-09 MED ORDER — MEGESTROL ACETATE 40 MG PO TABS
40.0000 mg | ORAL_TABLET | Freq: Two times a day (BID) | ORAL | 1 refills | Status: AC
  Filled 2024-03-09: qty 60, 30d supply, fill #0
  Filled 2024-03-09: qty 60, 15d supply, fill #0

## 2024-03-10 ENCOUNTER — Other Ambulatory Visit (HOSPITAL_COMMUNITY): Payer: Self-pay

## 2024-03-12 ENCOUNTER — Other Ambulatory Visit: Payer: Self-pay | Admitting: *Deleted

## 2024-03-12 DIAGNOSIS — N938 Other specified abnormal uterine and vaginal bleeding: Secondary | ICD-10-CM

## 2024-03-13 ENCOUNTER — Ambulatory Visit

## 2024-03-13 ENCOUNTER — Other Ambulatory Visit (HOSPITAL_COMMUNITY)
Admission: RE | Admit: 2024-03-13 | Discharge: 2024-03-13 | Disposition: A | Discharge status: Home / Self Care | Source: Ambulatory Visit | Attending: Family Medicine | Admitting: Family Medicine

## 2024-03-13 DIAGNOSIS — N938 Other specified abnormal uterine and vaginal bleeding: Secondary | ICD-10-CM | POA: Diagnosis not present

## 2024-03-14 ENCOUNTER — Ambulatory Visit: Payer: Self-pay | Admitting: Obstetrics & Gynecology

## 2024-03-14 LAB — HCG, SERUM, QUALITATIVE: hCG,Beta Subunit,Qual,Serum: NEGATIVE m[IU]/mL (ref <6)

## 2024-03-14 LAB — CERVICOVAGINAL ANCILLARY ONLY
Bacterial Vaginitis (gardnerella): NEGATIVE
Candida Glabrata: NEGATIVE
Candida Vaginitis: NEGATIVE
Chlamydia: NEGATIVE
Comment: NEGATIVE
Comment: NEGATIVE
Comment: NEGATIVE
Comment: NEGATIVE
Comment: NEGATIVE
Comment: NORMAL
Neisseria Gonorrhea: NEGATIVE
Trichomonas: NEGATIVE

## 2024-03-14 LAB — TSH: TSH: 2.58 u[IU]/mL (ref 0.450–4.500)

## 2024-03-14 LAB — CBC
Hematocrit: 36.9 % (ref 34.0–46.6)
Hemoglobin: 11.8 g/dL (ref 11.1–15.9)
MCH: 30.5 pg (ref 26.6–33.0)
MCHC: 32 g/dL (ref 31.5–35.7)
MCV: 95 fL (ref 79–97)
Platelets: 253 x10E3/uL (ref 150–450)
RBC: 3.87 x10E6/uL (ref 3.77–5.28)
RDW: 13.1 % (ref 11.7–15.4)
WBC: 7.2 x10E3/uL (ref 3.4–10.8)

## 2024-03-14 LAB — FERRITIN: Ferritin: 13 ng/mL — ABNORMAL LOW (ref 15–150)

## 2024-03-19 ENCOUNTER — Other Ambulatory Visit (HOSPITAL_BASED_OUTPATIENT_CLINIC_OR_DEPARTMENT_OTHER): Payer: Self-pay

## 2024-03-19 ENCOUNTER — Encounter (HOSPITAL_COMMUNITY): Payer: Self-pay | Admitting: Pharmacist

## 2024-04-09 ENCOUNTER — Encounter: Payer: Self-pay | Admitting: Obstetrics & Gynecology

## 2024-04-11 ENCOUNTER — Other Ambulatory Visit: Payer: Self-pay | Admitting: Obstetrics & Gynecology

## 2024-04-11 DIAGNOSIS — E282 Polycystic ovarian syndrome: Secondary | ICD-10-CM

## 2024-04-11 DIAGNOSIS — N809 Endometriosis, unspecified: Secondary | ICD-10-CM

## 2024-04-12 ENCOUNTER — Other Ambulatory Visit (HOSPITAL_BASED_OUTPATIENT_CLINIC_OR_DEPARTMENT_OTHER): Payer: Self-pay

## 2024-04-12 MED ORDER — NORELGESTROMIN-ETH ESTRADIOL 150-35 MCG/24HR TD PTWK
1.0000 | MEDICATED_PATCH | TRANSDERMAL | 5 refills | Status: AC
  Filled 2024-04-12: qty 12, 84d supply, fill #0
  Filled 2024-06-26: qty 12, 84d supply, fill #1

## 2024-04-23 ENCOUNTER — Other Ambulatory Visit (HOSPITAL_BASED_OUTPATIENT_CLINIC_OR_DEPARTMENT_OTHER): Payer: Self-pay

## 2024-04-23 ENCOUNTER — Encounter (HOSPITAL_BASED_OUTPATIENT_CLINIC_OR_DEPARTMENT_OTHER): Payer: Self-pay | Admitting: Family Medicine

## 2024-04-23 DIAGNOSIS — D485 Neoplasm of uncertain behavior of skin: Secondary | ICD-10-CM | POA: Diagnosis not present

## 2024-04-23 DIAGNOSIS — B081 Molluscum contagiosum: Secondary | ICD-10-CM | POA: Diagnosis not present

## 2024-04-23 DIAGNOSIS — L219 Seborrheic dermatitis, unspecified: Secondary | ICD-10-CM | POA: Diagnosis not present

## 2024-04-23 DIAGNOSIS — B360 Pityriasis versicolor: Secondary | ICD-10-CM | POA: Diagnosis not present

## 2024-04-23 MED ORDER — KETOCONAZOLE 2 % EX SHAM
1.0000 | MEDICATED_SHAMPOO | CUTANEOUS | 8 refills | Status: AC
  Filled 2024-04-23: qty 120, 20d supply, fill #0

## 2024-04-23 MED ORDER — KETOCONAZOLE 2 % EX SHAM
MEDICATED_SHAMPOO | CUTANEOUS | 8 refills | Status: AC
Start: 2024-04-23 — End: ?
  Filled 2024-04-23: qty 120, 30d supply, fill #0

## 2024-04-23 NOTE — Telephone Encounter (Signed)
 Please see mychart message sent by pt and advise.

## 2024-04-25 ENCOUNTER — Encounter (INDEPENDENT_AMBULATORY_CARE_PROVIDER_SITE_OTHER): Payer: Self-pay | Admitting: Obstetrics & Gynecology

## 2024-04-25 DIAGNOSIS — N809 Endometriosis, unspecified: Secondary | ICD-10-CM

## 2024-04-25 DIAGNOSIS — E282 Polycystic ovarian syndrome: Secondary | ICD-10-CM

## 2024-04-26 ENCOUNTER — Other Ambulatory Visit (HOSPITAL_COMMUNITY): Payer: Self-pay

## 2024-04-26 NOTE — Addendum Note (Signed)
 Addended by: HERCHEL GRUMET A on: 04/26/2024 04:57 PM   Modules accepted: Orders

## 2024-05-03 ENCOUNTER — Other Ambulatory Visit (HOSPITAL_BASED_OUTPATIENT_CLINIC_OR_DEPARTMENT_OTHER): Payer: Self-pay

## 2024-05-08 ENCOUNTER — Ambulatory Visit: Admitting: Obstetrics & Gynecology

## 2024-05-08 VITALS — BP 122/78 | HR 85 | Wt 131.0 lb

## 2024-05-08 DIAGNOSIS — N809 Endometriosis, unspecified: Secondary | ICD-10-CM

## 2024-05-08 DIAGNOSIS — N921 Excessive and frequent menstruation with irregular cycle: Secondary | ICD-10-CM | POA: Diagnosis not present

## 2024-05-08 NOTE — Progress Notes (Signed)
 GYNECOLOGY OFFICE VISIT NOTE  History:  Sherri Welch is a 33 y.o. G0P0000 here today for discussion about breakthrough bleeding (BTB) on the patch after being on it for over 3 years.  She says this happens at the 2-3 year mark with the other birth control options she tried; loved the Nexplanon but had significant BTB after 2 years. IUD has been placed twice, and expelled both times.  Unable to remember taking pills daily, Nuvaring kept getting expelled. Had a lot of BTB with Depo Provera .  She is frustrated.  Wants to discuss alternative methods for management of her endometriosis too.  Wants to get a pelvic ultrasound to evaluate for other causes of bleeding.  She denies any current abnormal vaginal discharge, bleeding, pelvic pain or other concerns.  Past Medical History:  Diagnosis Date   ADHD (attention deficit hyperactivity disorder)    Allergy    Anemia    Arthritis    knees and back   Blood transfusion without reported diagnosis    Complex regional pain syndrome type 1 affecting right upper arm 07/15/2017   Depression 04/15/2022   Endometriosis determined by laparoscopy 12/28/2019   GERD (gastroesophageal reflux disease)    Heart murmur    PCOS (polycystic ovarian syndrome)     Past Surgical History:  Procedure Laterality Date   LAPAROSCOPIC UNILATERAL SALPINGECTOMY Right 12/28/2019   Removal of Hydrosalpinx, diagnosed with endometriosis (pathology). Done at [facility].    The following portions of the patient's history were reviewed and updated as appropriate: allergies, current medications, past family history, past medical history, past social history, past surgical history and problem list.   Health Maintenance:  Normal pap and negative HRHPV on 07/26/2023.   Review of Systems:  Pertinent items noted in HPI and remainder of comprehensive ROS otherwise negative.  Physical Exam:  BP 122/78   Pulse 85   Wt 131 lb (59.4 kg)   LMP  (Approximate) Comment: 3wks ago  BMI 20.52  kg/m  CONSTITUTIONAL: Well-developed, well-nourished female in no acute distress.  HEENT:  Normocephalic, atraumatic. External right and left ear normal. No scleral icterus.  NECK: Normal range of motion, supple, no masses noted on observation SKIN: No rash noted. Not diaphoretic. No erythema. No pallor. MUSCULOSKELETAL: Normal range of motion. No edema noted. NEUROLOGIC: Alert and oriented to person, place, and time. Normal muscle tone coordination. No cranial nerve deficit noted on observation. PSYCHIATRIC: Normal mood and affect. Normal behavior. Normal judgment and thought content. CARDIOVASCULAR: Normal heart rate noted RESPIRATORY: Effort and breath sounds normal, no problems with respiration noted ABDOMEN: No masses or other overt distention noted on observation. No tenderness.   PELVIC: Deferred   Assessment and Plan:     1. Breakthrough bleeding on contraceptive patch (Primary) 2. Endometriosis Recommended trying LARCs again, but she declined this for now.  Discussed fertility awareness method, Phexxi, condoms for contraception for now.  She is considering stopping her patch and trying these methods for contraception Discussed GnRH medications for her endometriosis; discussed differences between Orilissa, MyFembree and Lupron. Information given to her to review. She will research these options and let us  know. Pelvic ultrasound ordered, will follow up results and manage accordingly.  Of note, patient had recent normal hemoglobin and TSH. - US  PELVIC COMPLETE WITH TRANSVAGINAL; Future  Routine preventative health maintenance measures emphasized. Please refer to After Visit Summary for other counseling recommendations.   Return for any gynecologic concerns.    I spent 30 minutes dedicated to the care of  this patient including pre-visit review of records, face to face time with the patient discussing her conditions and treatments, post visit ordering of medications and appropriate  tests or procedures, coordinating care and documenting this visit encounter.    GLORIS HUGGER, MD, FACOG Obstetrician & Gynecologist, [facility] for [employer], [facility]

## 2024-05-11 ENCOUNTER — Ambulatory Visit (HOSPITAL_COMMUNITY)

## 2024-05-22 ENCOUNTER — Ambulatory Visit (HOSPITAL_COMMUNITY)
Admission: RE | Admit: 2024-05-22 | Discharge: 2024-05-22 | Disposition: A | Discharge status: Home / Self Care | Source: Ambulatory Visit | Attending: Obstetrics & Gynecology | Admitting: Obstetrics & Gynecology

## 2024-05-22 ENCOUNTER — Ambulatory Visit: Payer: Self-pay | Admitting: Obstetrics & Gynecology

## 2024-05-22 DIAGNOSIS — D259 Leiomyoma of uterus, unspecified: Secondary | ICD-10-CM | POA: Diagnosis not present

## 2024-05-22 DIAGNOSIS — N921 Excessive and frequent menstruation with irregular cycle: Secondary | ICD-10-CM | POA: Insufficient documentation

## 2024-06-12 DIAGNOSIS — E282 Polycystic ovarian syndrome: Secondary | ICD-10-CM | POA: Diagnosis not present

## 2024-06-12 DIAGNOSIS — N971 Female infertility of tubal origin: Secondary | ICD-10-CM | POA: Diagnosis not present

## 2024-06-12 DIAGNOSIS — D259 Leiomyoma of uterus, unspecified: Secondary | ICD-10-CM | POA: Diagnosis not present

## 2024-06-12 DIAGNOSIS — N809 Endometriosis, unspecified: Secondary | ICD-10-CM | POA: Diagnosis not present

## 2024-06-14 ENCOUNTER — Other Ambulatory Visit (HOSPITAL_BASED_OUTPATIENT_CLINIC_OR_DEPARTMENT_OTHER): Payer: Self-pay

## 2024-06-14 MED ORDER — MEDROXYPROGESTERONE ACETATE 10 MG PO TABS
10.0000 mg | ORAL_TABLET | Freq: Every day | ORAL | 2 refills | Status: AC
  Filled 2024-06-14 – 2024-06-26 (×2): qty 30, 84d supply, fill #0

## 2024-06-14 NOTE — Addendum Note (Signed)
 Addended by: HERCHEL GRUMET A on: 06/14/2024 10:01 AM   Modules accepted: Orders

## 2024-06-20 DIAGNOSIS — N809 Endometriosis, unspecified: Secondary | ICD-10-CM | POA: Diagnosis not present

## 2024-06-20 DIAGNOSIS — D259 Leiomyoma of uterus, unspecified: Secondary | ICD-10-CM | POA: Diagnosis not present

## 2024-06-20 DIAGNOSIS — Z3141 Encounter for fertility testing: Secondary | ICD-10-CM | POA: Diagnosis not present

## 2024-06-20 DIAGNOSIS — E282 Polycystic ovarian syndrome: Secondary | ICD-10-CM | POA: Diagnosis not present

## 2024-06-20 DIAGNOSIS — Z113 Encounter for screening for infections with a predominantly sexual mode of transmission: Secondary | ICD-10-CM | POA: Diagnosis not present

## 2024-06-25 ENCOUNTER — Other Ambulatory Visit (HOSPITAL_BASED_OUTPATIENT_CLINIC_OR_DEPARTMENT_OTHER): Payer: Self-pay

## 2024-06-25 MED ORDER — VITAMIN D (ERGOCALCIFEROL) 1.25 MG (50000 UNIT) PO CAPS
50000.0000 [IU] | ORAL_CAPSULE | ORAL | 0 refills | Status: AC
  Filled 2024-06-25: qty 8, 56d supply, fill #0

## 2024-06-27 ENCOUNTER — Other Ambulatory Visit (HOSPITAL_BASED_OUTPATIENT_CLINIC_OR_DEPARTMENT_OTHER): Payer: Self-pay
